# Patient Record
Sex: Male | Born: 1945 | Race: Black or African American | Hispanic: No | Smoking: Former smoker
Health system: Southern US, Community
[De-identification: ages and names within clinical notes are randomized; demographics above are authoritative.]

## PROBLEM LIST (undated history)

## (undated) DIAGNOSIS — I219 Acute myocardial infarction, unspecified: Secondary | ICD-10-CM

---

## 1999-02-10 ENCOUNTER — Encounter: Admission: RE | Admit: 1999-02-10 | Discharge: 1999-03-17 | Payer: Self-pay | Admitting: *Deleted

## 2003-12-13 ENCOUNTER — Emergency Department (HOSPITAL_COMMUNITY): Admission: EM | Admit: 2003-12-13 | Discharge: 2003-12-13 | Payer: Self-pay | Admitting: Emergency Medicine

## 2004-12-14 ENCOUNTER — Emergency Department (HOSPITAL_COMMUNITY): Admission: EM | Admit: 2004-12-14 | Discharge: 2004-12-14 | Payer: Self-pay | Admitting: Emergency Medicine

## 2005-11-05 ENCOUNTER — Ambulatory Visit (HOSPITAL_COMMUNITY): Admission: RE | Admit: 2005-11-05 | Discharge: 2005-11-05 | Payer: Self-pay | Admitting: Surgery

## 2005-11-10 ENCOUNTER — Ambulatory Visit (HOSPITAL_COMMUNITY): Admission: RE | Admit: 2005-11-10 | Discharge: 2005-11-10 | Payer: Self-pay | Admitting: Surgery

## 2005-11-22 ENCOUNTER — Ambulatory Visit (HOSPITAL_COMMUNITY): Admission: RE | Admit: 2005-11-22 | Discharge: 2005-11-22 | Payer: Self-pay | Admitting: Surgery

## 2005-11-22 ENCOUNTER — Encounter (INDEPENDENT_AMBULATORY_CARE_PROVIDER_SITE_OTHER): Payer: Self-pay | Admitting: Specialist

## 2005-12-03 ENCOUNTER — Encounter (INDEPENDENT_AMBULATORY_CARE_PROVIDER_SITE_OTHER): Payer: Self-pay | Admitting: *Deleted

## 2005-12-03 ENCOUNTER — Inpatient Hospital Stay (HOSPITAL_COMMUNITY)
Admission: RE | Admit: 2005-12-03 | Discharge: 2005-12-10 | Payer: Self-pay | Admitting: Thoracic Surgery (Cardiothoracic Vascular Surgery)

## 2005-12-27 ENCOUNTER — Encounter
Admission: RE | Admit: 2005-12-27 | Discharge: 2005-12-27 | Payer: Self-pay | Admitting: Thoracic Surgery (Cardiothoracic Vascular Surgery)

## 2006-03-08 ENCOUNTER — Inpatient Hospital Stay (HOSPITAL_COMMUNITY): Admission: EM | Admit: 2006-03-08 | Discharge: 2006-04-02 | Payer: Self-pay | Admitting: Emergency Medicine

## 2006-03-08 ENCOUNTER — Ambulatory Visit: Payer: Self-pay | Admitting: Pulmonary Disease

## 2006-03-08 ENCOUNTER — Ambulatory Visit: Payer: Self-pay | Admitting: Internal Medicine

## 2006-03-28 ENCOUNTER — Encounter: Payer: Self-pay | Admitting: Cardiovascular Disease

## 2006-09-24 IMAGING — CR DG CHEST 1V PORT
1 series · 1 of 1 positions shown · non-contrast
Comparison: Yesterday?s exam.

CLINICAL DATA: Status-post left VATS. 
PORTABLE CHEST - 1 VIEW ([DATE] HOURS):

[view not recorded]
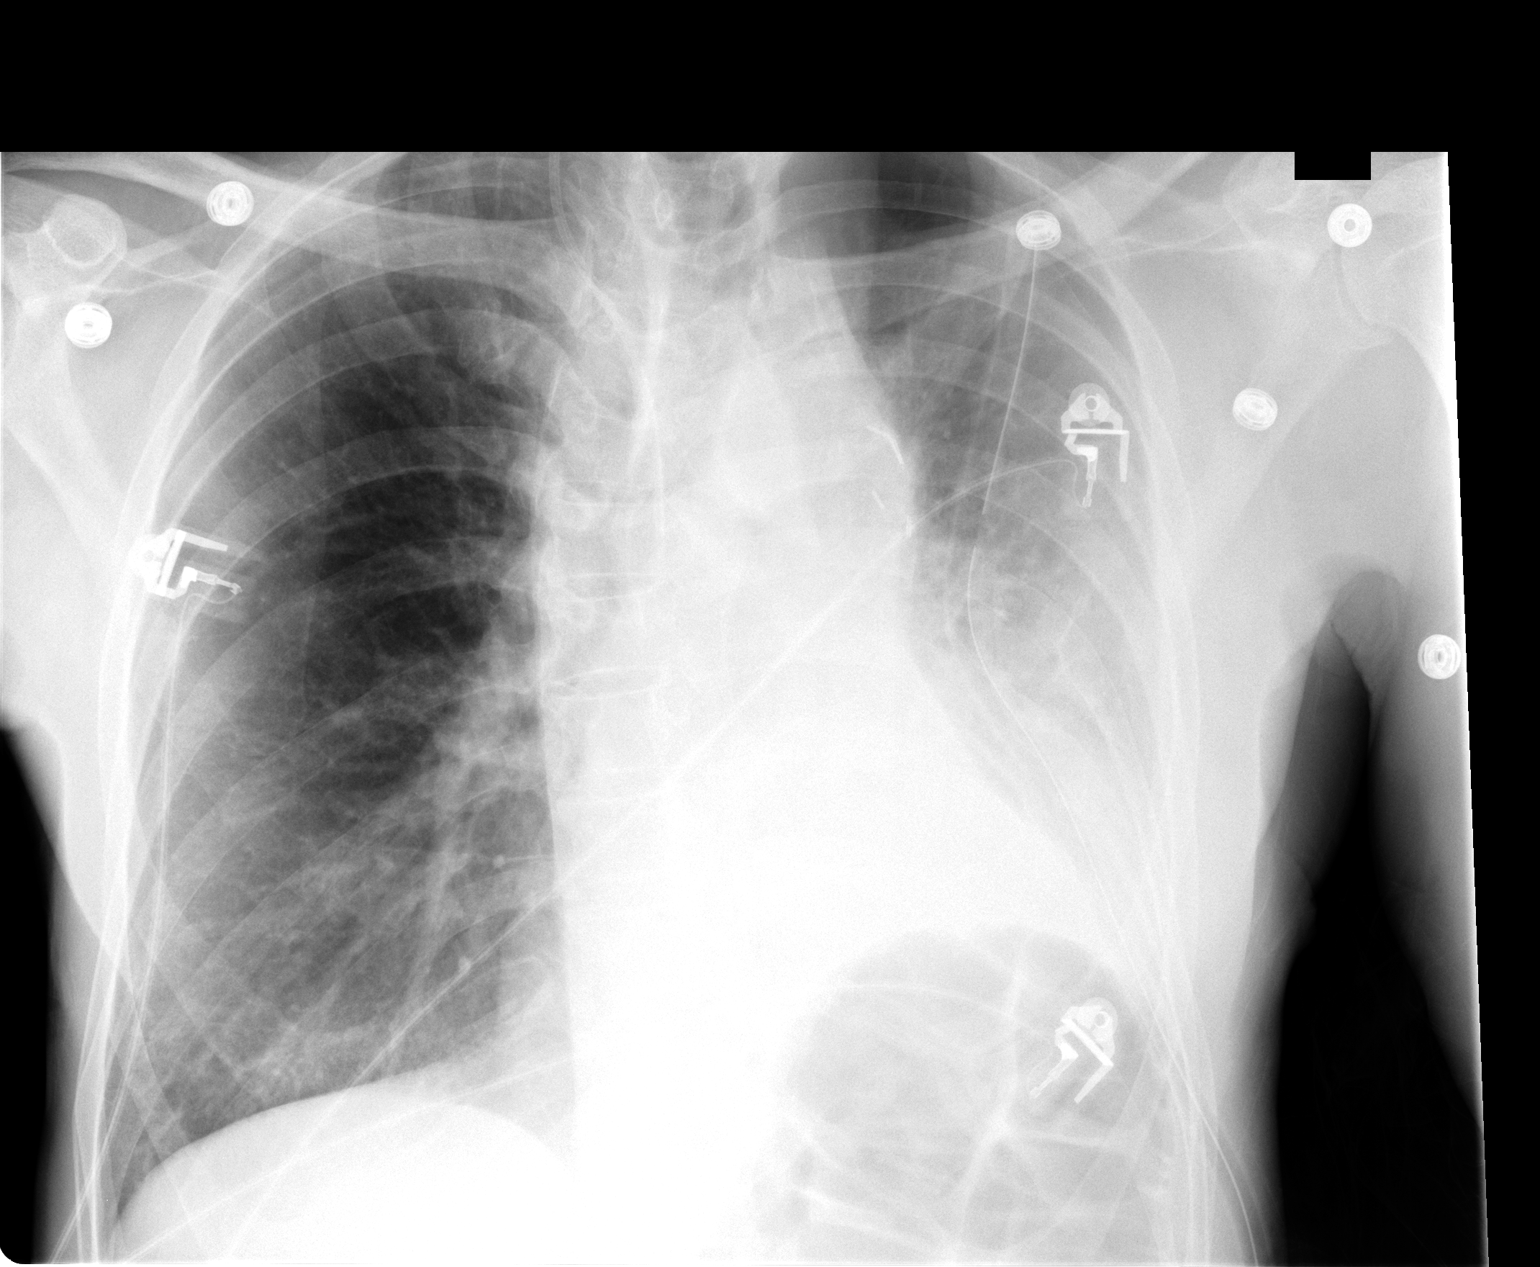

[1 of 1 positions shown; findings below may reference images not displayed]

FINDINGS: Atelectatic changes at the left base and left pneumothorax are again noted.  There may be a left pleural effusion.  Chest tube tip is at the apex of the left hemithorax.  Right jugular CVC tip is in the SVC.  
Left pneumothorax is estimated to be approximately 10% in degree slightly smaller than noted on the prior exam.
IMPRESSION: Slight decrease in small left apical pneumothorax.  Extensive left lung atelectatic changes and possible left pleural effusion.

## 2006-09-25 IMAGING — CR DG CHEST 1V PORT
1 series · 1 of 1 positions shown · non-contrast
Comparison: none

CLINICAL DATA: Left lung carcinoma.  Postop. Follow-up left pneumothorax after chest tube removal.
 PORTABLE CHEST - 1 VIEW (2211 hours):

[view not recorded]
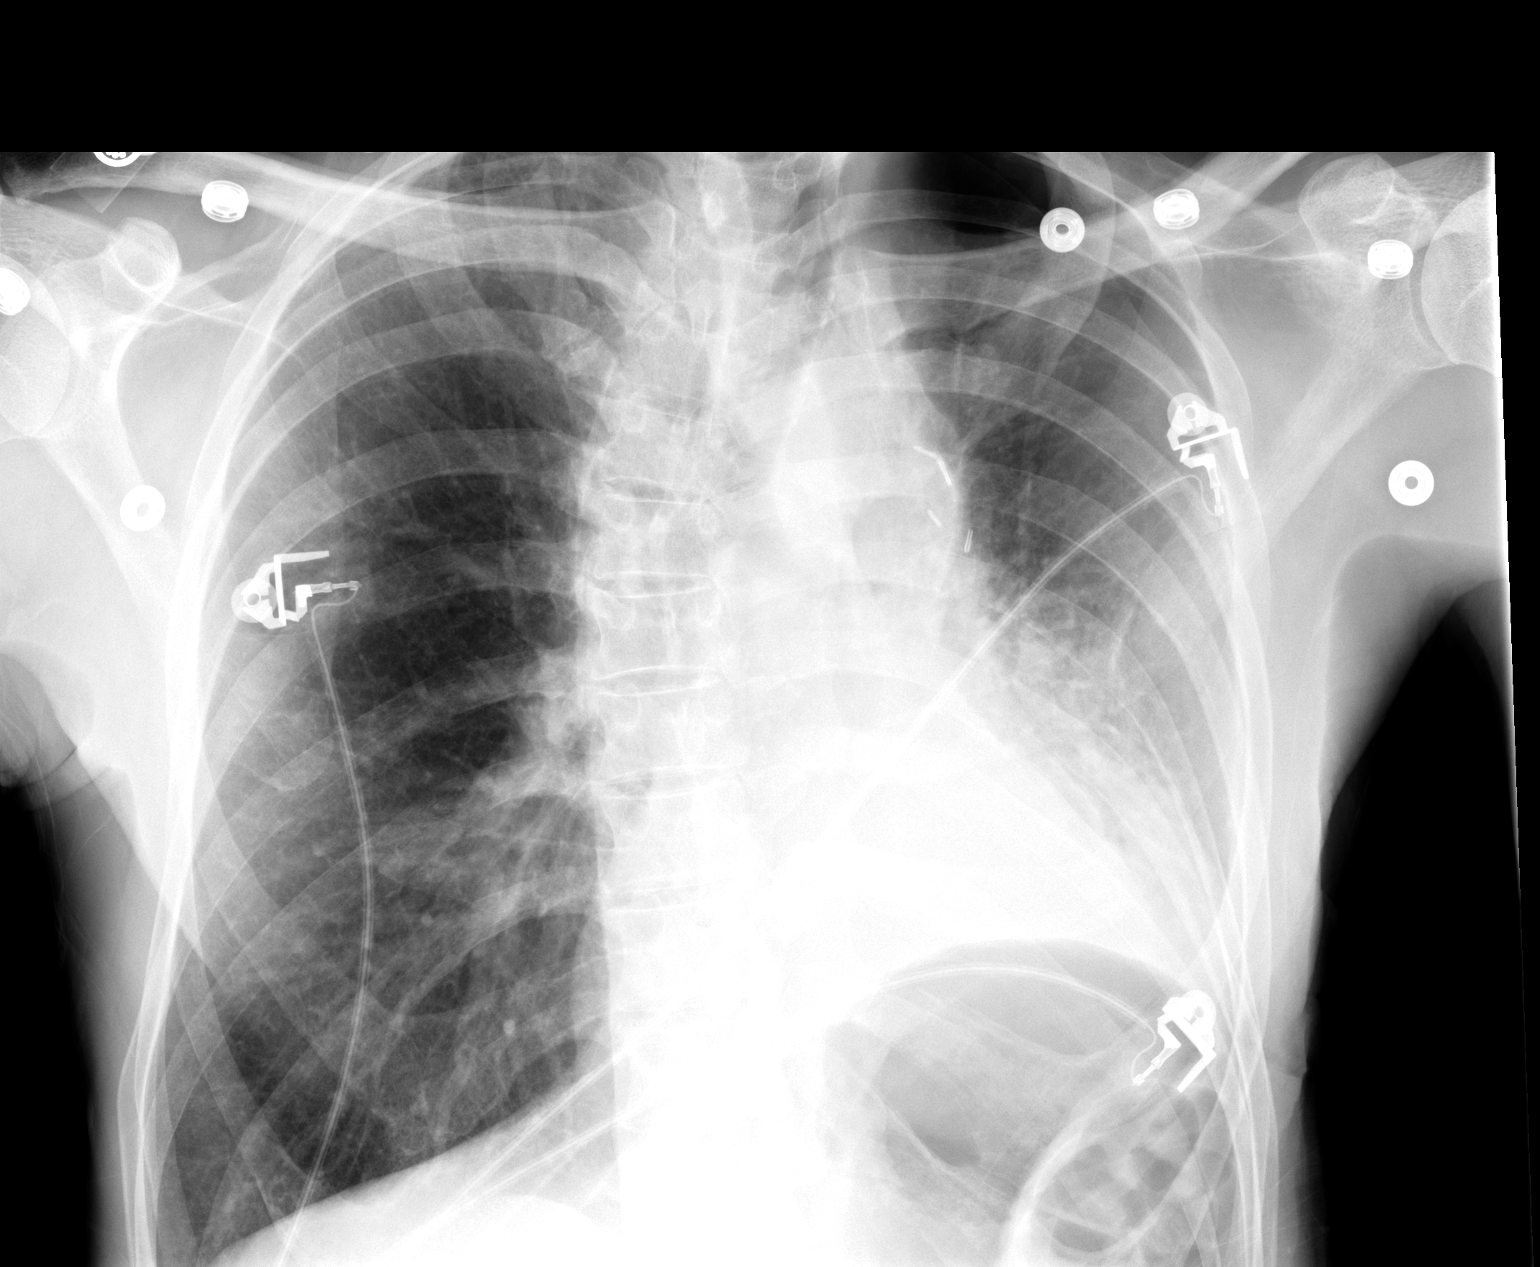

[1 of 1 positions shown; findings below may reference images not displayed]

FINDINGS: Compared to a prior study today at 5255 hours, the left chest tube has been removed.  A small approximately 10 to 15% left apical pneumothorax is slightly increased in size.  
 Persistent opacity is seen in the left lung base with associated volume loss and is unchanged. Right lung remains clear.
IMPRESSION: 1.  Mild interval increase in size of 10-15% left pneumothorax following chest tube removal.
 2.  Persistent left lower lung opacity and volume loss, without change.

## 2006-09-25 IMAGING — CR DG CHEST 1V PORT
1 series · 1 of 1 positions shown · non-contrast
Comparison: 12/08/05.

CLINICAL DATA: 59-year-old male, left lung cancer, status-post VATS. 
PORTABLE CHEST ? 1 VIEW:

[view not recorded]
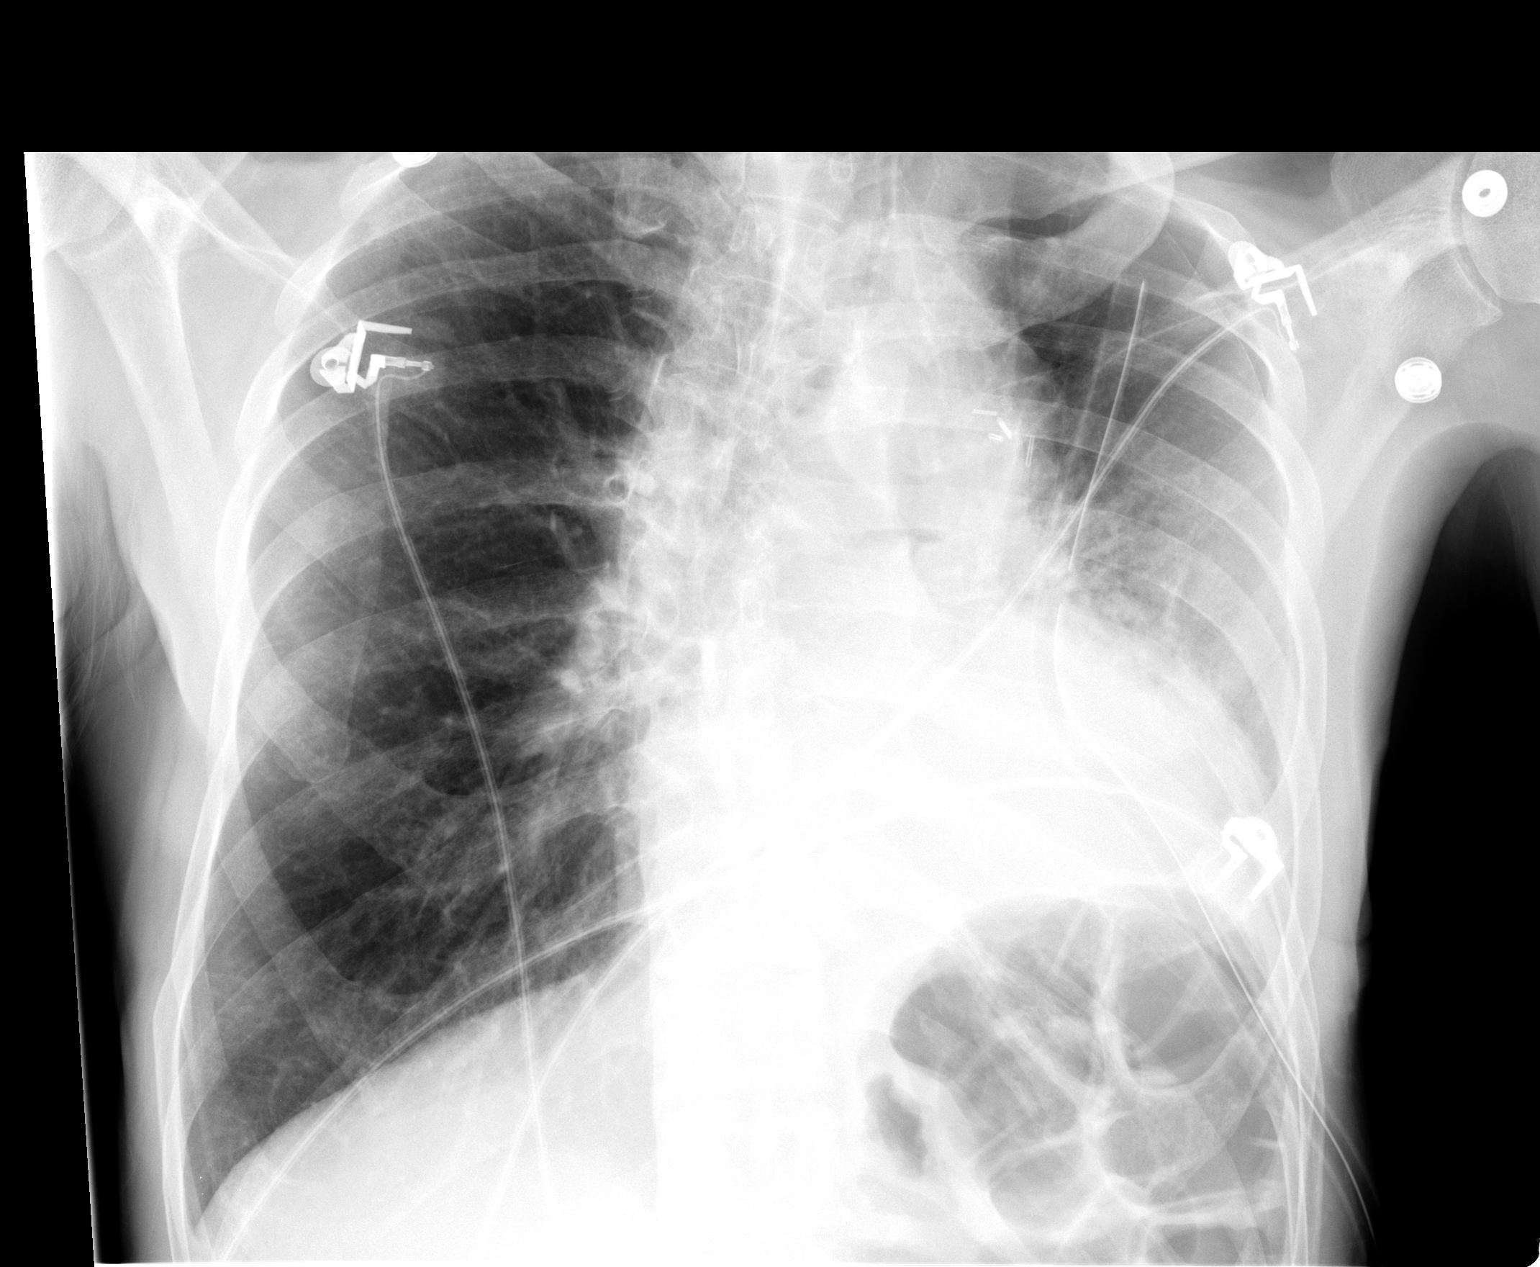

[1 of 1 positions shown; findings below may reference images not displayed]

FINDINGS: Left chest tube remains.  Postoperative changes, airspace disease and atelectasis remain in the left lower lobe.  Stable small left apical pneumothorax estimated at 10% or less.  Right lung remains well aerated.  Right central line has been removed.  Mild scoliosis of the upper thoracic spine.  Exam is rotated to the left.
IMPRESSION: 1.  Stable small left apical pneumothorax estimated at 10% or less.  
2.  Left chest tube remains.  
3.  Postoperative changes with airspace disease and atelectasis in the left lower lobe, unchanged.

## 2006-10-13 IMAGING — CR DG CHEST 2V
2 series · 2 of 2 positions shown · non-contrast
Comparison: 12/10/05 and 12/09/05.

CLINICAL DATA: Status post left upper lobectomy.  
 TWO VIEW CHEST:

[view not recorded (1 of 2)]
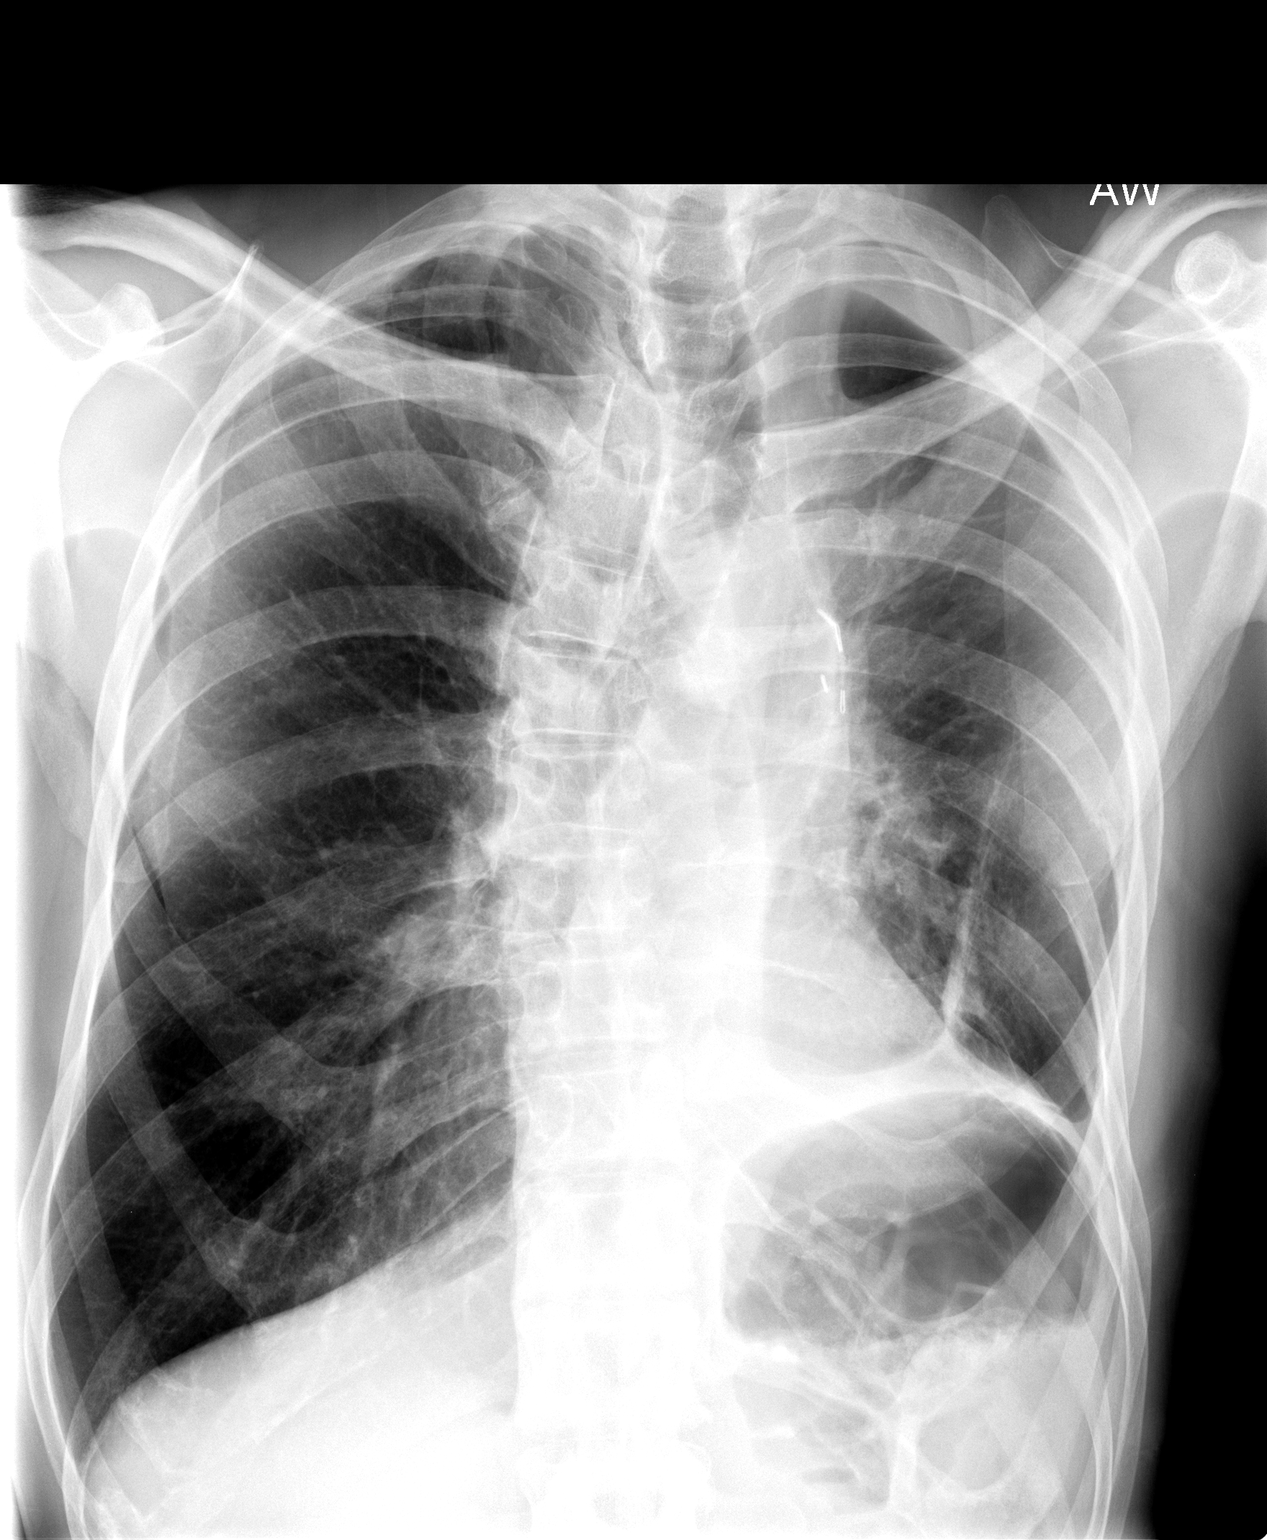

[view not recorded (2 of 2)]
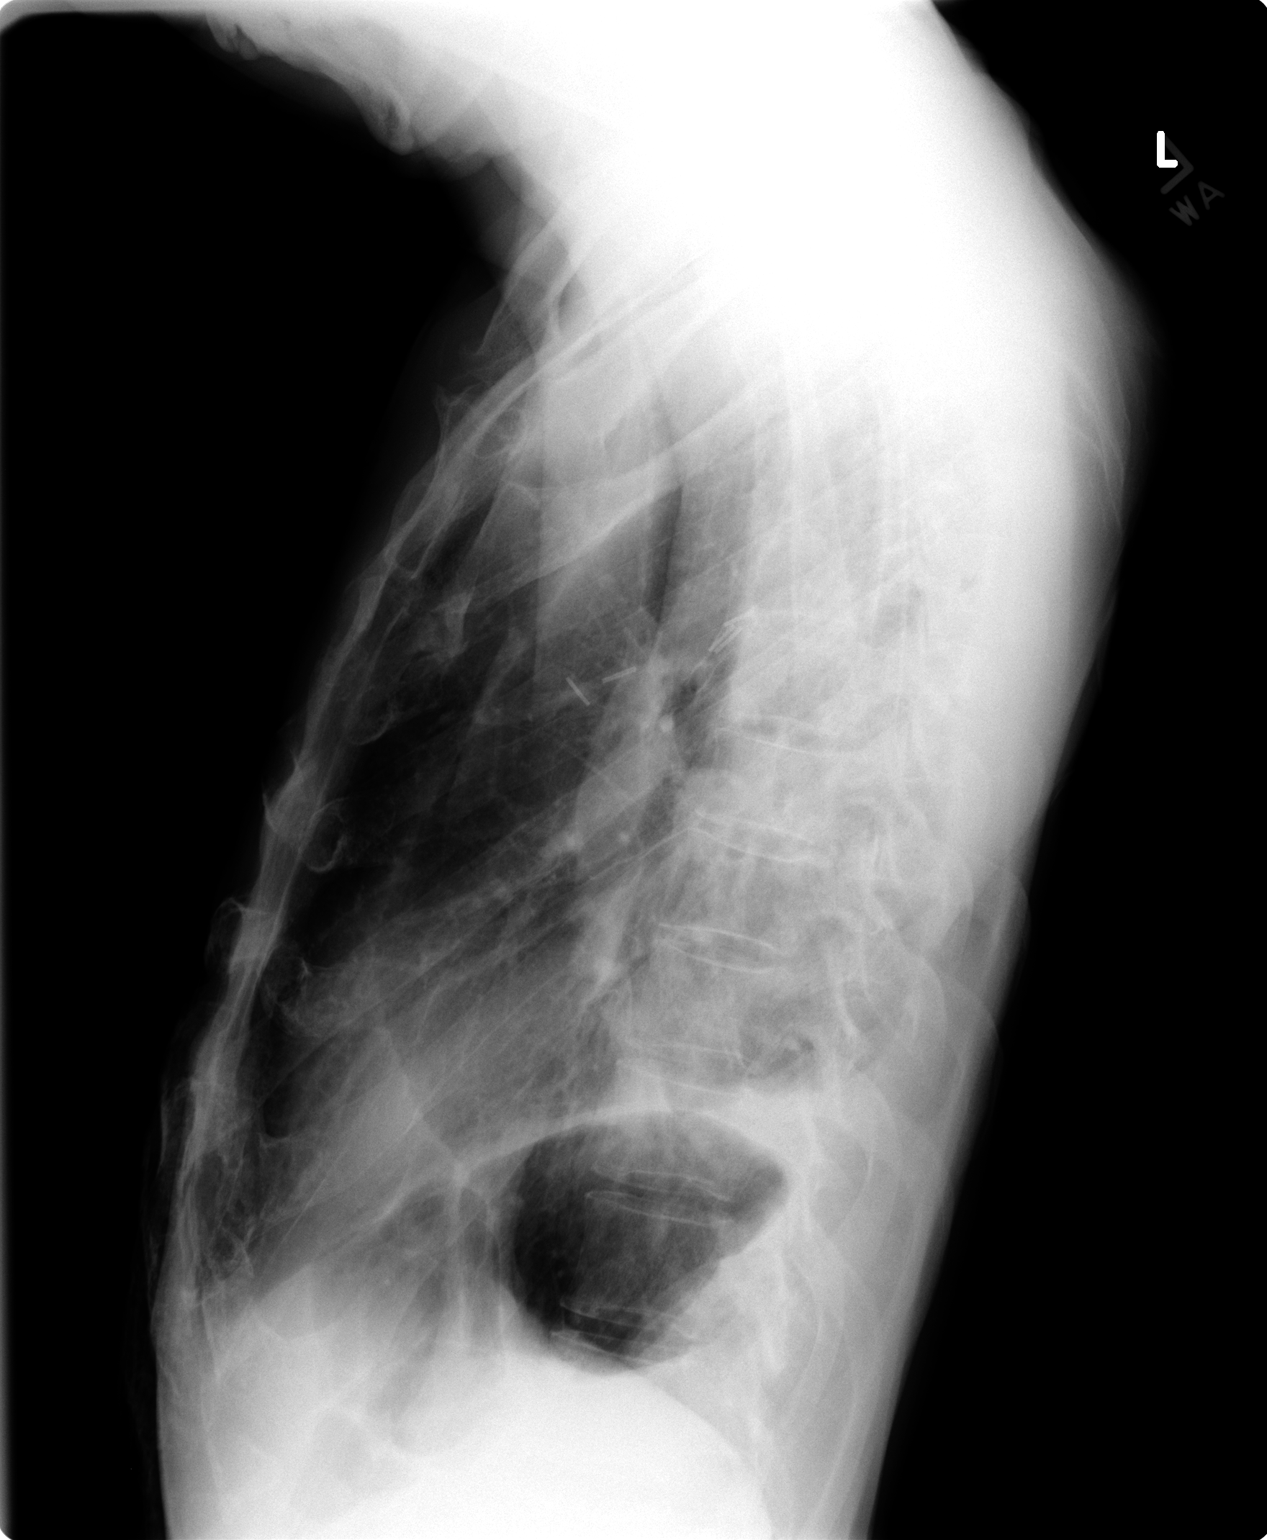

[2 of 2 positions shown; findings below may reference images not displayed]

FINDINGS: Spinal curvature with resultant distortion appearance of the chest.  Right lung clear.  Improved left base aeration with decreased air space disease.  Linear atelectasis remains.  Surgical changes left side of the   mediastinum and hilum.  Fluid now seen in the left pleural space without evidence of residual left pneumothorax.
IMPRESSION: 1.  Improved appearance of the chest.  The left sided pneumothorax has resolved with left apical pleural fluid now present. 
 2.  Improved left base aeration with minimal residual atelectasis or scarring remaining.

## 2007-07-05 ENCOUNTER — Encounter: Admission: RE | Admit: 2007-07-05 | Discharge: 2007-07-05 | Payer: Self-pay | Admitting: Internal Medicine

## 2007-10-12 ENCOUNTER — Emergency Department (HOSPITAL_COMMUNITY): Admission: EM | Admit: 2007-10-12 | Discharge: 2007-10-13 | Payer: Self-pay | Admitting: Emergency Medicine

## 2008-01-25 ENCOUNTER — Ambulatory Visit: Payer: Self-pay | Admitting: Cardiology

## 2008-01-26 ENCOUNTER — Ambulatory Visit: Payer: Self-pay | Admitting: Internal Medicine

## 2008-01-26 ENCOUNTER — Inpatient Hospital Stay (HOSPITAL_COMMUNITY): Admission: EM | Admit: 2008-01-26 | Discharge: 2008-02-03 | Payer: Self-pay | Admitting: Emergency Medicine

## 2008-01-26 ENCOUNTER — Ambulatory Visit: Payer: Self-pay | Admitting: Cardiology

## 2008-01-26 ENCOUNTER — Ambulatory Visit: Payer: Self-pay | Admitting: Oncology

## 2008-01-28 ENCOUNTER — Encounter (INDEPENDENT_AMBULATORY_CARE_PROVIDER_SITE_OTHER): Payer: Self-pay | Admitting: Internal Medicine

## 2008-02-21 ENCOUNTER — Ambulatory Visit: Payer: Self-pay | Admitting: Cardiovascular Disease

## 2008-05-14 ENCOUNTER — Ambulatory Visit: Payer: Self-pay | Admitting: Internal Medicine

## 2008-05-14 ENCOUNTER — Inpatient Hospital Stay (HOSPITAL_COMMUNITY): Admission: EM | Admit: 2008-05-14 | Discharge: 2008-05-17 | Payer: Self-pay | Admitting: Emergency Medicine

## 2009-07-14 ENCOUNTER — Ambulatory Visit: Payer: Self-pay | Admitting: Pulmonary Disease

## 2009-07-14 ENCOUNTER — Encounter (INDEPENDENT_AMBULATORY_CARE_PROVIDER_SITE_OTHER): Payer: Self-pay | Admitting: Internal Medicine

## 2009-07-14 ENCOUNTER — Inpatient Hospital Stay (HOSPITAL_COMMUNITY): Admission: EM | Admit: 2009-07-14 | Discharge: 2009-07-19 | Payer: Self-pay | Admitting: Emergency Medicine

## 2009-07-14 ENCOUNTER — Ambulatory Visit: Payer: Self-pay | Admitting: Cardiology

## 2009-07-25 DIAGNOSIS — I10 Essential (primary) hypertension: Secondary | ICD-10-CM | POA: Insufficient documentation

## 2009-07-25 DIAGNOSIS — E039 Hypothyroidism, unspecified: Secondary | ICD-10-CM | POA: Insufficient documentation

## 2009-08-04 ENCOUNTER — Ambulatory Visit: Payer: Self-pay | Admitting: Cardiology

## 2009-08-04 ENCOUNTER — Inpatient Hospital Stay (HOSPITAL_COMMUNITY): Admission: EM | Admit: 2009-08-04 | Discharge: 2009-08-07 | Payer: Self-pay | Admitting: Emergency Medicine

## 2009-08-05 ENCOUNTER — Encounter: Payer: Self-pay | Admitting: Cardiology

## 2009-08-21 ENCOUNTER — Ambulatory Visit: Payer: Self-pay | Admitting: Cardiology

## 2009-08-21 ENCOUNTER — Encounter (INDEPENDENT_AMBULATORY_CARE_PROVIDER_SITE_OTHER): Payer: Self-pay | Admitting: *Deleted

## 2009-08-21 DIAGNOSIS — I429 Cardiomyopathy, unspecified: Secondary | ICD-10-CM | POA: Insufficient documentation

## 2009-08-21 DIAGNOSIS — F1010 Alcohol abuse, uncomplicated: Secondary | ICD-10-CM | POA: Insufficient documentation

## 2009-08-21 DIAGNOSIS — I2510 Atherosclerotic heart disease of native coronary artery without angina pectoris: Secondary | ICD-10-CM | POA: Insufficient documentation

## 2009-08-21 DIAGNOSIS — F17200 Nicotine dependence, unspecified, uncomplicated: Secondary | ICD-10-CM | POA: Insufficient documentation

## 2009-08-21 DIAGNOSIS — I9589 Other hypotension: Secondary | ICD-10-CM | POA: Insufficient documentation

## 2009-10-17 ENCOUNTER — Ambulatory Visit: Payer: Self-pay | Admitting: Internal Medicine

## 2009-11-10 ENCOUNTER — Ambulatory Visit (HOSPITAL_COMMUNITY): Admission: RE | Admit: 2009-11-10 | Discharge: 2009-11-10 | Payer: Self-pay | Admitting: Internal Medicine

## 2009-11-10 ENCOUNTER — Encounter: Payer: Self-pay | Admitting: Internal Medicine

## 2009-11-10 ENCOUNTER — Ambulatory Visit: Payer: Self-pay | Admitting: Cardiology

## 2009-11-10 ENCOUNTER — Ambulatory Visit: Payer: Self-pay

## 2009-12-19 ENCOUNTER — Encounter: Payer: Self-pay | Admitting: Internal Medicine

## 2010-01-01 ENCOUNTER — Encounter (INDEPENDENT_AMBULATORY_CARE_PROVIDER_SITE_OTHER): Payer: Self-pay | Admitting: *Deleted

## 2010-06-22 ENCOUNTER — Inpatient Hospital Stay (HOSPITAL_COMMUNITY)
Admission: EM | Admit: 2010-06-22 | Discharge: 2010-07-02 | Disposition: A | Discharge status: Other Acute Inpatient Hospital | Payer: Self-pay | Source: Home / Self Care | Attending: Internal Medicine | Admitting: Internal Medicine

## 2010-08-13 NOTE — Miscellaneous (Signed)
  Clinical Lists Changes  Observations: Added new observation of ECHOINTERP: - Left ventricle: The cavity size was normal. Wall thickness was       normal. Systolic function was normal. The estimated ejection       fraction was in the range of 55% to 60%. Wall motion was normal;       there were no regional wall motion abnormalities.     - Mitral valve: Mild regurgitation. (11/10/2009 9:51)      Echocardiogram  Procedure date:  11/10/2009  Findings:      - Left ventricle: The cavity size was normal. Wall thickness was       normal. Systolic function was normal. The estimated ejection       fraction was in the range of 55% to 60%. Wall motion was normal;       there were no regional wall motion abnormalities.     - Mitral valve: Mild regurgitation.

## 2010-08-13 NOTE — Miscellaneous (Signed)
  Clinical Lists Changes  Medications: Added new medication of COREG 3.125 MG TABS (CARVEDILOL) take one two times a day - Signed Added new medication of DIGOXIN 0.25 MG TABS (DIGOXIN) take one daily - Signed Added new medication of HYDRALAZINE HCL 25 MG TABS (HYDRALAZINE HCL) take 1/2 daily - Signed Added new medication of SPIRONOLACTONE 25 MG TABS (SPIRONOLACTONE) take one daily - Signed Added new medication of THIAMINE HCL 100 MG TABS (THIAMINE HCL) take one daily - Signed Added new medication of ASPIR-LOW 81 MG TBEC (ASPIRIN) take one daily Added new medication of FOSINOPRIL SODIUM 20 MG TABS (FOSINOPRIL SODIUM) take one dialy

## 2010-08-13 NOTE — Assessment & Plan Note (Signed)
Summary: per check out/sf   Visit Type:  Follow-up [facility]  CC:  no complaints.  History of Present Illness: Ryan Brewer is a 65 year old man with h/o ETOH abuse, Copd, lung cancer s/p lobectomy in 2007.  Carries a h/o of NSTEMI and underwent cath in 2009 with mild nonobstructive CAD.  Earlier this year admitted with ETOH related seizure. Troponin mildly elevated. Got echo: Ejection fraction 20-25% with akinesis of the anteroseptal myocardium and apical myocardium. There was akinesis of the mid distal inferior myocardium. Felt to be non-ischemic. The patient was diuresed and placed on ACE inhibitor and Coreg.  Lisinopril recently increased but developed hypotension  nd had to be cut back. Returns for f/u.  Doing well. No longer drinking. Does get dyspneic on mild to moderate exertion but feels like he is getting stronger. No CP, palpitations, edema, orthopnea or PND. Taking meds. Not drinking at all.  Current Medications (verified): 1)  Coreg 3.125 Mg Tabs (Carvedilol) .... Take One Two Times A Day 2)  Aspir-Low 81 Mg Tbec (Aspirin) .... Take One Daily 3)  Combivent 18-103 Mcg/act Aero (Ipratropium-Albuterol) .... As Needed 4)  Flintstones Complete 60 Mg Chew (Pediatric Multivit-Minerals-C) .... Take One Daily 5)  Fosinopril Sodium 10 Mg Tabs (Fosinopril Sodium) .... Take One Tablet Daily  Allergies (verified): No Known Drug Allergies  Past History:  Past Medical History: Last updated: 07/25/2009 1. Hypertension.   2. Cancer of the lung.   3. Hypothyroidism.   4. Previous myocardial infarction with hypokinesis, ejection fraction       20%-30%.   5. History of pneumonia.   6. Status post left lobectomy because of lung cancer.   7. Significant alcohol abuse with recurrent withdrawal seizure.      Review of Systems       As per HPI and past medical history; otherwise all systems negative.   Vital Signs:  Patient profile:   65 year old male Height:      75  inches Weight:      171 pounds BMI:     21.45 Pulse rate:   89 / minute BP sitting:   119 / 75  (left arm) Cuff size:   regular  Vitals Entered By: Burnett Kanaris, CNA (October 17, 2009 9:10 AM)  Physical Exam  General:  Thin, in no acute distress. HEENT: poor dentition Neck: No JVD, HJR, Bruit, or thyroid enlargement Lungs:Decreased breath sounds throughout, No tachypnea, clear without wheezing, rales, or rhonchi Cardiovascular: RRR, positive S4, 2/6 systolic murmur at the left sternal border and apex, no bruit, thrill, or heave. Abdomen: BS normal. Soft without organomegaly, masses, lesions or tenderness. Extremities: Traces of left ankle edema,without cyanosis, clubbing. Good distal pulses bilateral SKin: Warm, no lesions or rashes  Musculoskeletal: No deformities Neuro: no focal signs    Impression & Recommendations:  Problem # 1:  CARDIOMYOPATHY, SECONDARY (ICD-425.9) Suspect this non-ischemic. Volume status looks good. Will titrate coreg to 6.25 two times a day slowly by increasing pm dose first then am dose. See back in 1 month with repeat echo. If no improvement then will need repeat cath. Congratulated him on ETOH cessation.   Other Orders: Echocardiogram (Echo)  Patient Instructions: 1)  Your physician recommends that you schedule a follow-up appointment in: 2 months 2)  Your physician has recommended you make the following change in your medication: Increase carvedilol to 6.25 mg twice a day.  Start by taking 6.25 mg at night for 5 days  and then increase to twice a day 3)  Your physician has requested that you have an echocardiogram.  Echocardiography is a painless test that uses sound waves to create images of your heart. It provides your doctor with information about the size and shape of your heart and how well your heart's chambers and valves are working.  This procedure takes approximately one hour. There are no restrictions for this  procedure. Prescriptions: CARVEDILOL 6.25 MG TABS (CARVEDILOL) one twice a day  #60 x 3   Entered by:   Charolotte Capuchin, RN   Authorized by:   Dolores Patty, MD, [facility]   Signed by:   Charolotte Capuchin, RN on 10/17/2009   Method used:   Electronically to        [facility] (retail)       [address]       [place], [place]  [address]       Ph: [phone]       Fax: [phone]   RxID:   [id number]

## 2010-08-13 NOTE — Consult Note (Signed)
Summary: [facility]   Imported By: Roderic Ovens 08/08/2009 14:29:21  _____________________________________________________________________  External Attachment:    Type:   Image     Comment:   External Document

## 2010-08-13 NOTE — Letter (Signed)
Summary: Appointment - Missed  [facility]     [place], [place]    Phone:   Fax:      January 01, 2010 MRN: [id number]   Ryan Brewer [address] [place], [place]  [address]   Dear Ryan Brewer,  Our records indicate you missed your appointment on June 13,2011with Dr. Gala Romney.  It is very important that we reach you to reschedule this appointment. We look forward to participating in your health care needs. Please contact us at the number listed above at your earliest convenience to reschedule this appointment.     Sincerely,    Lorne Skeens  [facility] Scheduling Team

## 2010-08-13 NOTE — Assessment & Plan Note (Signed)
Summary: eph/ gd   Visit Type:  Follow-up  CC:  problems with bp.  History of Present Illness: This is a 65 year old, African American male, patient, who was admitted to the hospital with acute systolic heart failure and alcoholic seizure. 2-D echo, January 3 showed severely reduced systolic function. Ejection fraction 20-25% with akinesis of the anteroseptal myocardium and apical myocardium. There was akinesis of the mid distal inferior myocardium. The patient was diuresed and placed on ACE inhibitor and Coreg.  Since he's been home he complains of low blood pressures and some dizziness. He says his blood pressure runs from 80/50 to high of 105/60. His blood pressure is only 70 systolic in the office today, but he denies dizziness. He has occasional dizziness when he stands up quickly. He denies any presyncope. The patient does not drive. He denies alcohol use since hospitalization.  He does have a history of a non-ST elevation MI by cardiac catheter in the past revealed nonobstructive coronary disease. I catheter his LV function was normal in 2 days later. An echo showed EF of 20-25%. The patient is followed in the [facility] as well.  Current Medications (verified): 1)  Coreg 3.125 Mg Tabs (Carvedilol) .... Take One Two Times A Day 2)  Aspir-Low 81 Mg Tbec (Aspirin) .... Take One Daily 3)  Fosinopril Sodium 20 Mg Tabs (Fosinopril Sodium) .... Take One Dialy 4)  Combivent 18-103 Mcg/act Aero (Ipratropium-Albuterol) .... As Needed 5)  Flintstones Complete 60 Mg Chew (Pediatric Multivit-Minerals-C) .... Take One Daily  Past History:  Past Medical History: Last updated: 07/25/2009 1. Hypertension.   2. Cancer of the lung.   3. Hypothyroidism.   4. Previous myocardial infarction with hypokinesis, ejection fraction       20%-30%.   5. History of pneumonia.   6. Status post left lobectomy because of lung cancer.   7. Significant alcohol abuse with recurrent withdrawal seizure.       Review of Systems       see history of present illness  Vital Signs:  Patient profile:   65 year old male Height:      75 inches Weight:      149 pounds BMI:     18.69 Pulse rate:   104 / minute Pulse rhythm:   regular BP sitting:   70 / 50  (left arm)  Vitals Entered By: Jacquelin Hawking, CMA (August 21, 2009 9:25 AM)  Physical Exam  General:  Thin, in no acute distress. Neck: No JVD, HJR, Bruit, or thyroid enlargement Lungs:Decreased breath sounds throughout, No tachypnea, clear without wheezing, rales, or rhonchi Cardiovascular: RRR, positive S4, 2/6 systolic murmur at the left sternal border and apex, no bruit, thrill, or heave. Abdomen: BS normal. Soft without organomegaly, masses, lesions or tenderness. Extremities: Traces of left ankle edema,without cyanosis, clubbing. Good distal pulses bilateral SKin: Warm, no lesions or rashes  Musculoskeletal: No deformities Neuro: no focal signs    Impression & Recommendations:  Problem # 1:  CARDIOMYOPATHY, SECONDARY (ICD-425.9) The patient had acute systolic congestive heart failure in the hospital. Ejection fraction is 20-25%. He is being treated with Coreg and lisinopril. Unfortunately, his blood pressure is now tolerating this. We will decrease his fosinopril 10 mg daily. If the dizziness and hypotension. Continue this will need to be stopped. He is euvolemic today. The following medications were removed from the medication list:    Digoxin 0.25 Mg Tabs (Digoxin) .Marland Kitchen... Take one daily    Spironolactone 25 Mg Tabs (Spironolactone) .Marland KitchenMarland KitchenMarland KitchenMarland Kitchen  Take one daily    Fosinopril Sodium 20 Mg Tabs (Fosinopril sodium) .Marland Kitchen... Take one dialy His updated medication list for this problem includes:    Coreg 3.125 Mg Tabs (Carvedilol) .Marland Kitchen... Take one two times a day    Aspir-low 81 Mg Tbec (Aspirin) .Marland Kitchen... Take one daily    Fosinopril Sodium 10 Mg Tabs (Fosinopril sodium) .Marland Kitchen... Take one tablet daily  Problem # 2:  CAD, NATIVE VESSEL  (ICD-414.01)  Patient had previous MIs, but cardiac catheter in 2010 revealed nonobstructive coronary disease. The following medications were removed from the medication list:    Fosinopril Sodium 20 Mg Tabs (Fosinopril sodium) .Marland Kitchen... Take one dialy His updated medication list for this problem includes:    Coreg 3.125 Mg Tabs (Carvedilol) .Marland Kitchen... Take one two times a day    Aspir-low 81 Mg Tbec (Aspirin) .Marland Kitchen... Take one daily    Fosinopril Sodium 10 Mg Tabs (Fosinopril sodium) .Marland Kitchen... Take one tablet daily  The following medications were removed from the medication list:    Fosinopril Sodium 20 Mg Tabs (Fosinopril sodium) .Marland Kitchen... Take one dialy His updated medication list for this problem includes:    Coreg 3.125 Mg Tabs (Carvedilol) .Marland Kitchen... Take one two times a day    Aspir-low 81 Mg Tbec (Aspirin) .Marland Kitchen... Take one daily    Fosinopril Sodium 10 Mg Tabs (Fosinopril sodium) .Marland Kitchen... Take one tablet daily  Problem # 3:  ALCOHOL ABUSE (ICD-305.00) Patient had alcoholic seizure in the hospital bed and has abstained from alcohol since  Problem # 4:  TOBACCO ABUSE (ICD-305.1) smoking cessation is encouraged  Patient Instructions: 1)  Your physician recommends that you schedule a follow-up appointment in: 2 months with Dr. Gala Romney 2)  Your physician has recommended you make the following change in your medication: Take 1/2 tablet of the 20mg  Fosinopril tablet till finish.Then take  1/4 fo the  40 mg tablet till finish .Then start the new   Fosinopril 10 mg daily. 3)  Your MD recommends to stop smoking. Prescriptions: COREG 3.125 MG TABS (CARVEDILOL) take one two times a day  #60 x 6   Entered by:   Ollen Gross, RN, BSN   Authorized by:   Marletta Lor, PA-C   Signed by:   Ollen Gross, RN, BSN on 08/21/2009   Method used:   Electronically to        [facility] (retail)       [address]       [place], [place]  [address]       Ph: [phone]       Fax:  [phone]   RxID:   [id number] FOSINOPRIL SODIUM 10 MG TABS (FOSINOPRIL SODIUM) take one tablet daily  #30 x 6   Entered by:   Ollen Gross, RN, BSN   Authorized by:   Marletta Lor, PA-C   Signed by:   Ollen Gross, RN, BSN on 08/21/2009   Method used:   Electronically to        [facility] (retail)       [address]       [place], [place]  [address]       Ph: [phone]       Fax: [phone]   RxID:   [id number]

## 2010-08-23 ENCOUNTER — Emergency Department (HOSPITAL_COMMUNITY)
Admission: EM | Admit: 2010-08-23 | Discharge: 2010-08-23 | Disposition: A | Discharge status: Home / Self Care | Payer: Medicare Other | Attending: Emergency Medicine | Admitting: Emergency Medicine

## 2010-08-23 ENCOUNTER — Emergency Department (HOSPITAL_COMMUNITY): Payer: Medicare Other

## 2010-08-23 DIAGNOSIS — R0609 Other forms of dyspnea: Secondary | ICD-10-CM | POA: Insufficient documentation

## 2010-08-23 DIAGNOSIS — R05 Cough: Secondary | ICD-10-CM | POA: Insufficient documentation

## 2010-08-23 DIAGNOSIS — R0602 Shortness of breath: Secondary | ICD-10-CM | POA: Insufficient documentation

## 2010-08-23 DIAGNOSIS — M549 Dorsalgia, unspecified: Secondary | ICD-10-CM | POA: Insufficient documentation

## 2010-08-23 DIAGNOSIS — R0989 Other specified symptoms and signs involving the circulatory and respiratory systems: Secondary | ICD-10-CM | POA: Insufficient documentation

## 2010-08-23 DIAGNOSIS — I10 Essential (primary) hypertension: Secondary | ICD-10-CM | POA: Insufficient documentation

## 2010-08-23 DIAGNOSIS — M79609 Pain in unspecified limb: Secondary | ICD-10-CM | POA: Insufficient documentation

## 2010-08-23 DIAGNOSIS — Z85118 Personal history of other malignant neoplasm of bronchus and lung: Secondary | ICD-10-CM | POA: Insufficient documentation

## 2010-08-23 DIAGNOSIS — R059 Cough, unspecified: Secondary | ICD-10-CM | POA: Insufficient documentation

## 2010-08-23 DIAGNOSIS — M25559 Pain in unspecified hip: Secondary | ICD-10-CM | POA: Insufficient documentation

## 2010-08-23 DIAGNOSIS — I252 Old myocardial infarction: Secondary | ICD-10-CM | POA: Insufficient documentation

## 2010-08-23 DIAGNOSIS — Z902 Acquired absence of lung [part of]: Secondary | ICD-10-CM | POA: Insufficient documentation

## 2010-08-23 LAB — DIFFERENTIAL
Basophils Absolute: 0 10*3/uL (ref 0.0–0.1)
Basophils Relative: 0 % (ref 0–1)
Eosinophils Absolute: 0.1 10*3/uL (ref 0.0–0.7)
Eosinophils Relative: 1 % (ref 0–5)
Lymphocytes Relative: 11 % — ABNORMAL LOW (ref 12–46)

## 2010-08-23 LAB — POCT I-STAT, CHEM 8
BUN: 21 mg/dL (ref 6–23)
Chloride: 105 mEq/L (ref 96–112)
HCT: 38 % — ABNORMAL LOW (ref 39.0–52.0)
Sodium: 141 mEq/L (ref 135–145)

## 2010-08-23 LAB — URINE MICROSCOPIC-ADD ON

## 2010-08-23 LAB — URINALYSIS, ROUTINE W REFLEX MICROSCOPIC
Bilirubin Urine: NEGATIVE
Ketones, ur: NEGATIVE mg/dL
Protein, ur: NEGATIVE mg/dL
Urine Glucose, Fasting: NEGATIVE mg/dL
pH: 5 (ref 5.0–8.0)

## 2010-08-23 LAB — CBC
Platelets: 184 10*3/uL (ref 150–400)
RDW: 14.2 % (ref 11.5–15.5)
WBC: 7.8 10*3/uL (ref 4.0–10.5)

## 2010-08-23 MED ORDER — IOHEXOL 300 MG/ML  SOLN
100.0000 mL | Freq: Once | INTRAMUSCULAR | Status: AC | PRN
  Administered 2010-08-23: 100 mL via INTRAVENOUS

## 2010-09-09 ENCOUNTER — Encounter (INDEPENDENT_AMBULATORY_CARE_PROVIDER_SITE_OTHER): Payer: Medicare Other

## 2010-09-09 ENCOUNTER — Encounter: Payer: Self-pay | Admitting: Cardiovascular Disease

## 2010-09-09 DIAGNOSIS — M79609 Pain in unspecified limb: Secondary | ICD-10-CM

## 2010-09-17 NOTE — Miscellaneous (Signed)
Summary: Orders Update  Clinical Lists Changes  Problems: Added new problem of FOOT PAIN, BILATERAL (ICD-729.5) Orders: Added new Test order of Arterial Duplex Lower Extremity (Arterial Duplex Low) - Signed

## 2010-09-21 LAB — HEMOGLOBIN AND HEMATOCRIT, BLOOD
HCT: 24.1 % — ABNORMAL LOW (ref 39.0–52.0)
HCT: 24.3 % — ABNORMAL LOW (ref 39.0–52.0)
HCT: 25.2 % — ABNORMAL LOW (ref 39.0–52.0)
Hemoglobin: 8.4 g/dL — ABNORMAL LOW (ref 13.0–17.0)
Hemoglobin: 8.5 g/dL — ABNORMAL LOW (ref 13.0–17.0)
Hemoglobin: 8.9 g/dL — ABNORMAL LOW (ref 13.0–17.0)

## 2010-09-21 LAB — BASIC METABOLIC PANEL
BUN: 3 mg/dL — ABNORMAL LOW (ref 6–23)
BUN: 4 mg/dL — ABNORMAL LOW (ref 6–23)
BUN: 4 mg/dL — ABNORMAL LOW (ref 6–23)
BUN: 5 mg/dL — ABNORMAL LOW (ref 6–23)
BUN: 5 mg/dL — ABNORMAL LOW (ref 6–23)
BUN: 6 mg/dL (ref 6–23)
CO2: 24 mEq/L (ref 19–32)
Calcium: 7.9 mg/dL — ABNORMAL LOW (ref 8.4–10.5)
Calcium: 8.4 mg/dL (ref 8.4–10.5)
Calcium: 8.5 mg/dL (ref 8.4–10.5)
Chloride: 103 mEq/L (ref 96–112)
Chloride: 105 mEq/L (ref 96–112)
Chloride: 107 mEq/L (ref 96–112)
Chloride: 107 mEq/L (ref 96–112)
Chloride: 108 mEq/L (ref 96–112)
Chloride: 109 mEq/L (ref 96–112)
Creatinine, Ser: 0.63 mg/dL (ref 0.4–1.5)
Creatinine, Ser: 0.67 mg/dL (ref 0.4–1.5)
Creatinine, Ser: 0.76 mg/dL (ref 0.4–1.5)
Creatinine, Ser: 0.94 mg/dL (ref 0.4–1.5)
GFR calc Af Amer: >60 mL/min (ref >60)
GFR calc Af Amer: >60 mL/min (ref >60)
GFR calc non Af Amer: >60 mL/min (ref >60)
GFR calc non Af Amer: >60 mL/min (ref >60)
GFR calc non Af Amer: >60 mL/min (ref >60)
GFR calc non Af Amer: >60 mL/min (ref >60)
GFR calc non Af Amer: >60 mL/min (ref >60)
Glucose, Bld: 103 mg/dL — ABNORMAL HIGH (ref 70–99)
Glucose, Bld: 119 mg/dL — ABNORMAL HIGH (ref 70–99)
Glucose, Bld: 124 mg/dL — ABNORMAL HIGH (ref 70–99)
Glucose, Bld: 89 mg/dL (ref 70–99)
Potassium: 2.9 mEq/L — ABNORMAL LOW (ref 3.5–5.1)
Potassium: 3.5 mEq/L (ref 3.5–5.1)
Potassium: 3.6 mEq/L (ref 3.5–5.1)
Potassium: 3.8 mEq/L (ref 3.5–5.1)
Sodium: 138 mEq/L (ref 135–145)
Sodium: 140 mEq/L (ref 135–145)
Sodium: 140 mEq/L (ref 135–145)

## 2010-09-21 LAB — CBC
HCT: 23.1 % — ABNORMAL LOW (ref 39.0–52.0)
HCT: 25.1 % — ABNORMAL LOW (ref 39.0–52.0)
Hemoglobin: 7.9 g/dL — ABNORMAL LOW (ref 13.0–17.0)
Hemoglobin: 7.9 g/dL — ABNORMAL LOW (ref 13.0–17.0)
Hemoglobin: 8.7 g/dL — ABNORMAL LOW (ref 13.0–17.0)
MCH: 33.5 pg (ref 26.0–34.0)
MCH: 34.2 pg — ABNORMAL HIGH (ref 26.0–34.0)
MCHC: 34.2 g/dL (ref 30.0–36.0)
MCV: 100 fL (ref 78.0–100.0)
MCV: 97.1 fL (ref 78.0–100.0)
MCV: 98 fL (ref 78.0–100.0)
MCV: 98 fL (ref 78.0–100.0)
MCV: 98.7 fL (ref 78.0–100.0)
Platelets: 109 10*3/uL — ABNORMAL LOW (ref 150–400)
Platelets: 112 10*3/uL — ABNORMAL LOW (ref 150–400)
RBC: 2.31 MIL/uL — ABNORMAL LOW (ref 4.22–5.81)
RBC: 2.45 MIL/uL — ABNORMAL LOW (ref 4.22–5.81)
RDW: 16.5 % — ABNORMAL HIGH (ref 11.5–15.5)
RDW: 16.7 % — ABNORMAL HIGH (ref 11.5–15.5)
RDW: 16.9 % — ABNORMAL HIGH (ref 11.5–15.5)
WBC: 10.6 10*3/uL — ABNORMAL HIGH (ref 4.0–10.5)
WBC: 5.7 10*3/uL (ref 4.0–10.5)
WBC: 6.6 10*3/uL (ref 4.0–10.5)
WBC: 7.2 10*3/uL (ref 4.0–10.5)

## 2010-09-21 LAB — VANCOMYCIN, TROUGH: Vancomycin Tr: 13.6 ug/mL (ref 10.0–20.0)

## 2010-09-21 LAB — HEPATIC FUNCTION PANEL
AST: 26 U/L (ref 0–37)
Bilirubin, Direct: 0.5 mg/dL — ABNORMAL HIGH (ref 0.0–0.3)
Indirect Bilirubin: 0.9 mg/dL (ref 0.3–0.9)
Total Protein: 7.2 g/dL (ref 6.0–8.3)

## 2010-09-21 LAB — IRON AND TIBC
Iron: 39 ug/dL — ABNORMAL LOW (ref 42–135)
Saturation Ratios: 32 % (ref 20–55)
UIBC: 83 ug/dL

## 2010-09-21 LAB — VITAMIN B12: Vitamin B-12: 1608 pg/mL — ABNORMAL HIGH (ref 211–911)

## 2010-09-21 LAB — MAGNESIUM
Magnesium: 1.5 mg/dL (ref 1.5–2.5)
Magnesium: 1.8 mg/dL (ref 1.5–2.5)

## 2010-09-21 LAB — PHOSPHORUS: Phosphorus: 2.6 mg/dL (ref 2.3–4.6)

## 2010-09-22 LAB — COMPREHENSIVE METABOLIC PANEL
ALT: 39 U/L (ref 0–53)
ALT: 53 U/L (ref 0–53)
AST: 49 U/L — ABNORMAL HIGH (ref 0–37)
Albumin: 1.9 g/dL — ABNORMAL LOW (ref 3.5–5.2)
BUN: 14 mg/dL (ref 6–23)
CO2: 28 mEq/L (ref 19–32)
CO2: 28 mEq/L (ref 19–32)
Calcium: 7.8 mg/dL — ABNORMAL LOW (ref 8.4–10.5)
Calcium: 8.1 mg/dL — ABNORMAL LOW (ref 8.4–10.5)
Creatinine, Ser: 0.88 mg/dL (ref 0.4–1.5)
GFR calc Af Amer: >60 mL/min (ref >60)
GFR calc non Af Amer: >60 mL/min (ref >60)
GFR calc non Af Amer: >60 mL/min (ref >60)
Glucose, Bld: 133 mg/dL — ABNORMAL HIGH (ref 70–99)
Sodium: 135 mEq/L (ref 135–145)
Sodium: 140 mEq/L (ref 135–145)

## 2010-09-22 LAB — CBC
HCT: 19.2 % — ABNORMAL LOW (ref 39.0–52.0)
HCT: 26.6 % — ABNORMAL LOW (ref 39.0–52.0)
Hemoglobin: 6.8 g/dL — CL (ref 13.0–17.0)
Hemoglobin: 7.6 g/dL — ABNORMAL LOW (ref 13.0–17.0)
Hemoglobin: 9.4 g/dL — ABNORMAL LOW (ref 13.0–17.0)
MCH: 36 pg — ABNORMAL HIGH (ref 26.0–34.0)
MCH: 36 pg — ABNORMAL HIGH (ref 26.0–34.0)
MCHC: 34.7 g/dL (ref 30.0–36.0)
MCHC: 35.3 g/dL (ref 30.0–36.0)
MCV: 101.6 fL — ABNORMAL HIGH (ref 78.0–100.0)
MCV: 101.9 fL — ABNORMAL HIGH (ref 78.0–100.0)
Platelets: 130 10*3/uL — ABNORMAL LOW (ref 150–400)
RBC: 1.89 MIL/uL — ABNORMAL LOW (ref 4.22–5.81)
RBC: 2.15 MIL/uL — ABNORMAL LOW (ref 4.22–5.81)
RDW: 14.8 % (ref 11.5–15.5)
WBC: 5.8 10*3/uL (ref 4.0–10.5)

## 2010-09-22 LAB — IRON AND TIBC
Iron: 102 ug/dL (ref 42–135)
Iron: 47 ug/dL (ref 42–135)
Saturation Ratios: 35 % (ref 20–55)
TIBC: 133 ug/dL — ABNORMAL LOW (ref 215–435)
UIBC: <55 ug/dL

## 2010-09-22 LAB — HEPATIC FUNCTION PANEL
ALT: 34 U/L (ref 0–53)
ALT: 53 U/L (ref 0–53)
AST: 109 U/L — ABNORMAL HIGH (ref 0–37)
AST: 42 U/L — ABNORMAL HIGH (ref 0–37)
Albumin: 2.3 g/dL — ABNORMAL LOW (ref 3.5–5.2)
Alkaline Phosphatase: 171 U/L — ABNORMAL HIGH (ref 39–117)
Alkaline Phosphatase: 93 U/L (ref 39–117)
Bilirubin, Direct: 0.7 mg/dL — ABNORMAL HIGH (ref 0.0–0.3)
Indirect Bilirubin: 1.3 mg/dL — ABNORMAL HIGH (ref 0.3–0.9)
Total Bilirubin: 2 mg/dL — ABNORMAL HIGH (ref 0.3–1.2)
Total Protein: 8.3 g/dL (ref 6.0–8.3)

## 2010-09-22 LAB — URINE MICROSCOPIC-ADD ON

## 2010-09-22 LAB — URINALYSIS, ROUTINE W REFLEX MICROSCOPIC
Glucose, UA: NEGATIVE mg/dL
pH: 6.5 (ref 5.0–8.0)

## 2010-09-22 LAB — BASIC METABOLIC PANEL
CO2: 27 mEq/L (ref 19–32)
Chloride: 107 mEq/L (ref 96–112)
Creatinine, Ser: 0.73 mg/dL (ref 0.4–1.5)
GFR calc Af Amer: >60 mL/min (ref >60)
Potassium: 3.1 mEq/L — ABNORMAL LOW (ref 3.5–5.1)

## 2010-09-22 LAB — URINE CULTURE
Culture  Setup Time: 201112130243
Culture: NO GROWTH

## 2010-09-22 LAB — CROSSMATCH
ABO/RH(D): O POS
Unit division: 0

## 2010-09-22 LAB — VITAMIN B12: Vitamin B-12: 1526 pg/mL — ABNORMAL HIGH (ref 211–911)

## 2010-09-22 LAB — CULTURE, BLOOD (ROUTINE X 2): Culture  Setup Time: 201112130117

## 2010-09-22 LAB — DIFFERENTIAL
Eosinophils Absolute: 0.1 10*3/uL (ref 0.0–0.7)
Eosinophils Absolute: 0.2 10*3/uL (ref 0.0–0.7)
Eosinophils Relative: 2 % (ref 0–5)
Lymphs Abs: 1.4 10*3/uL (ref 0.7–4.0)
Lymphs Abs: 2.2 10*3/uL (ref 0.7–4.0)
Monocytes Absolute: 0.4 10*3/uL (ref 0.1–1.0)
Monocytes Relative: 5 % (ref 3–12)
Neutro Abs: 5.6 10*3/uL (ref 1.7–7.7)
Neutrophils Relative %: 67 % (ref 43–77)

## 2010-09-22 LAB — FERRITIN
Ferritin: 2045 ng/mL — ABNORMAL HIGH (ref 22–322)
Ferritin: 2102 ng/mL — ABNORMAL HIGH (ref 22–322)

## 2010-09-22 LAB — HEMOGLOBIN AND HEMATOCRIT, BLOOD
HCT: 20.5 % — ABNORMAL LOW (ref 39.0–52.0)
HCT: 21 % — ABNORMAL LOW (ref 39.0–52.0)
HCT: 24.8 % — ABNORMAL LOW (ref 39.0–52.0)
Hemoglobin: 7.1 g/dL — ABNORMAL LOW (ref 13.0–17.0)
Hemoglobin: 8.7 g/dL — ABNORMAL LOW (ref 13.0–17.0)

## 2010-09-22 LAB — RAPID URINE DRUG SCREEN, HOSP PERFORMED: Benzodiazepines: NOT DETECTED

## 2010-09-22 LAB — TSH: TSH: 1.256 u[IU]/mL (ref 0.350–4.500)

## 2010-09-22 LAB — ABO/RH: ABO/RH(D): O POS

## 2010-09-22 LAB — FOLATE
Folate: 4.8 ng/mL
Folate: 4.9 ng/mL
Folate: 5 ng/mL

## 2010-09-22 LAB — AMMONIA: Ammonia: 16 umol/L (ref 11–35)

## 2010-09-26 ENCOUNTER — Emergency Department (HOSPITAL_COMMUNITY)
Admission: EM | Admit: 2010-09-26 | Discharge: 2010-09-26 | Disposition: A | Discharge status: Home / Self Care | Payer: Medicare Other | Attending: Emergency Medicine | Admitting: Emergency Medicine

## 2010-09-26 DIAGNOSIS — L0201 Cutaneous abscess of face: Secondary | ICD-10-CM | POA: Insufficient documentation

## 2010-09-26 DIAGNOSIS — L03211 Cellulitis of face: Secondary | ICD-10-CM | POA: Insufficient documentation

## 2010-09-26 DIAGNOSIS — I252 Old myocardial infarction: Secondary | ICD-10-CM | POA: Insufficient documentation

## 2010-09-26 DIAGNOSIS — I10 Essential (primary) hypertension: Secondary | ICD-10-CM | POA: Insufficient documentation

## 2010-09-27 LAB — COMPREHENSIVE METABOLIC PANEL
ALT: 59 U/L — ABNORMAL HIGH (ref 0–53)
ALT: 73 U/L — ABNORMAL HIGH (ref 0–53)
ALT: 29 U/L (ref 0–53)
AST: 78 U/L — ABNORMAL HIGH (ref 0–37)
Albumin: 2.2 g/dL — ABNORMAL LOW (ref 3.5–5.2)
Albumin: 2.4 g/dL — ABNORMAL LOW (ref 3.5–5.2)
Alkaline Phosphatase: 79 U/L (ref 39–117)
Alkaline Phosphatase: 91 U/L (ref 39–117)
Alkaline Phosphatase: 73 U/L (ref 39–117)
BUN: 9 mg/dL (ref 6–23)
CO2: 26 mEq/L (ref 19–32)
CO2: 30 mEq/L (ref 19–32)
CO2: 32 mEq/L (ref 19–32)
CO2: 22 mEq/L (ref 19–32)
Calcium: 7.7 mg/dL — ABNORMAL LOW (ref 8.4–10.5)
Calcium: 8.1 mg/dL — ABNORMAL LOW (ref 8.4–10.5)
Calcium: 8.3 mg/dL — ABNORMAL LOW (ref 8.4–10.5)
Chloride: 97 mEq/L (ref 96–112)
Creatinine, Ser: 0.7 mg/dL (ref 0.4–1.5)
Creatinine, Ser: 0.72 mg/dL (ref 0.4–1.5)
GFR calc Af Amer: >60 mL/min (ref >60)
GFR calc Af Amer: >60 mL/min (ref >60)
GFR calc non Af Amer: >60 mL/min (ref >60)
GFR calc non Af Amer: >60 mL/min (ref >60)
GFR calc non Af Amer: >60 mL/min (ref >60)
GFR calc non Af Amer: >60 mL/min (ref >60)
Glucose, Bld: 169 mg/dL — ABNORMAL HIGH (ref 70–99)
Glucose, Bld: 220 mg/dL — ABNORMAL HIGH (ref 70–99)
Glucose, Bld: 110 mg/dL — ABNORMAL HIGH (ref 70–99)
Potassium: 2.2 mEq/L — CL (ref 3.5–5.1)
Potassium: 3.2 mEq/L — ABNORMAL LOW (ref 3.5–5.1)
Potassium: 4.8 mEq/L (ref 3.5–5.1)
Sodium: 135 mEq/L (ref 135–145)
Sodium: 137 mEq/L (ref 135–145)
Total Bilirubin: 1.1 mg/dL (ref 0.3–1.2)
Total Bilirubin: 1.7 mg/dL — ABNORMAL HIGH (ref 0.3–1.2)
Total Bilirubin: 0.8 mg/dL (ref 0.3–1.2)
Total Protein: 6 g/dL (ref 6.0–8.3)
Total Protein: 6.4 g/dL (ref 6.0–8.3)

## 2010-09-27 LAB — GLUCOSE, CAPILLARY
Glucose-Capillary: 111 mg/dL — ABNORMAL HIGH (ref 70–99)
Glucose-Capillary: 161 mg/dL — ABNORMAL HIGH (ref 70–99)
Glucose-Capillary: 166 mg/dL — ABNORMAL HIGH (ref 70–99)
Glucose-Capillary: 175 mg/dL — ABNORMAL HIGH (ref 70–99)
Glucose-Capillary: 182 mg/dL — ABNORMAL HIGH (ref 70–99)
Glucose-Capillary: 234 mg/dL — ABNORMAL HIGH (ref 70–99)

## 2010-09-27 LAB — BASIC METABOLIC PANEL
BUN: 13 mg/dL (ref 6–23)
BUN: 9 mg/dL (ref 6–23)
BUN: 41 mg/dL — ABNORMAL HIGH (ref 6–23)
BUN: 54 mg/dL — ABNORMAL HIGH (ref 6–23)
CO2: 30 mEq/L (ref 19–32)
CO2: 20 mEq/L (ref 19–32)
CO2: 21 mEq/L (ref 19–32)
Calcium: 8.3 mg/dL — ABNORMAL LOW (ref 8.4–10.5)
Calcium: 9.4 mg/dL (ref 8.4–10.5)
Chloride: 97 mEq/L (ref 96–112)
Chloride: 99 mEq/L (ref 96–112)
Chloride: 107 mEq/L (ref 96–112)
Chloride: 110 mEq/L (ref 96–112)
Creatinine, Ser: 0.73 mg/dL (ref 0.4–1.5)
Creatinine, Ser: 0.88 mg/dL (ref 0.4–1.5)
Creatinine, Ser: 1.54 mg/dL — ABNORMAL HIGH (ref 0.4–1.5)
Creatinine, Ser: 3.1 mg/dL — ABNORMAL HIGH (ref 0.4–1.5)
GFR calc Af Amer: >60 mL/min (ref >60)
GFR calc Af Amer: >60 mL/min (ref >60)
GFR calc non Af Amer: >60 mL/min (ref >60)
GFR calc non Af Amer: >60 mL/min (ref >60)
GFR calc non Af Amer: 46 mL/min — ABNORMAL LOW (ref >60)
Glucose, Bld: 157 mg/dL — ABNORMAL HIGH (ref 70–99)
Glucose, Bld: 174 mg/dL — ABNORMAL HIGH (ref 70–99)
Glucose, Bld: 216 mg/dL — ABNORMAL HIGH (ref 70–99)
Glucose, Bld: 102 mg/dL — ABNORMAL HIGH (ref 70–99)
Glucose, Bld: 124 mg/dL — ABNORMAL HIGH (ref 70–99)
Potassium: 3 mEq/L — ABNORMAL LOW (ref 3.5–5.1)
Potassium: 3.1 mEq/L — ABNORMAL LOW (ref 3.5–5.1)
Potassium: 5.3 mEq/L — ABNORMAL HIGH (ref 3.5–5.1)
Sodium: 135 mEq/L (ref 135–145)
Sodium: 136 mEq/L (ref 135–145)
Sodium: 134 mEq/L — ABNORMAL LOW (ref 135–145)

## 2010-09-27 LAB — CBC
HCT: 29.8 % — ABNORMAL LOW (ref 39.0–52.0)
HCT: 30.3 % — ABNORMAL LOW (ref 39.0–52.0)
HCT: 33.1 % — ABNORMAL LOW (ref 39.0–52.0)
Hemoglobin: 10.4 g/dL — ABNORMAL LOW (ref 13.0–17.0)
Hemoglobin: 10.7 g/dL — ABNORMAL LOW (ref 13.0–17.0)
Hemoglobin: 11.3 g/dL — ABNORMAL LOW (ref 13.0–17.0)
Hemoglobin: 13.8 g/dL (ref 13.0–17.0)
Hemoglobin: 12.1 g/dL — ABNORMAL LOW (ref 13.0–17.0)
Hemoglobin: 13.7 g/dL (ref 13.0–17.0)
MCHC: 34.3 g/dL (ref 30.0–36.0)
MCHC: 35.3 g/dL (ref 30.0–36.0)
MCHC: 35.3 g/dL (ref 30.0–36.0)
MCHC: 35.4 g/dL (ref 30.0–36.0)
MCHC: 34 g/dL (ref 30.0–36.0)
MCHC: 34.6 g/dL (ref 30.0–36.0)
MCV: 108.8 fL — ABNORMAL HIGH (ref 78.0–100.0)
MCV: 109.1 fL — ABNORMAL HIGH (ref 78.0–100.0)
MCV: 110.1 fL — ABNORMAL HIGH (ref 78.0–100.0)
MCV: 108.9 fL — ABNORMAL HIGH (ref 78.0–100.0)
Platelets: 60 10*3/uL — ABNORMAL LOW (ref 150–400)
Platelets: 78 10*3/uL — ABNORMAL LOW (ref 150–400)
Platelets: 253 10*3/uL (ref 150–400)
RBC: 2.78 MIL/uL — ABNORMAL LOW (ref 4.22–5.81)
RBC: 3.01 MIL/uL — ABNORMAL LOW (ref 4.22–5.81)
RBC: 3.6 MIL/uL — ABNORMAL LOW (ref 4.22–5.81)
RBC: 3.27 MIL/uL — ABNORMAL LOW (ref 4.22–5.81)
RDW: 13.8 % (ref 11.5–15.5)
RDW: 14 % (ref 11.5–15.5)
RDW: 14.1 % (ref 11.5–15.5)
RDW: 12.8 % (ref 11.5–15.5)
WBC: 5.1 10*3/uL (ref 4.0–10.5)

## 2010-09-27 LAB — DIFFERENTIAL
Basophils Absolute: 0 10*3/uL (ref 0.0–0.1)
Basophils Absolute: 0 10*3/uL (ref 0.0–0.1)
Basophils Absolute: 0.1 10*3/uL (ref 0.0–0.1)
Basophils Relative: 1 % (ref 0–1)
Basophils Relative: 1 % (ref 0–1)
Eosinophils Absolute: 0 10*3/uL (ref 0.0–0.7)
Eosinophils Absolute: 0.5 10*3/uL (ref 0.0–0.7)
Lymphocytes Relative: 10 % — ABNORMAL LOW (ref 12–46)
Lymphocytes Relative: 8 % — ABNORMAL LOW (ref 12–46)
Lymphs Abs: 0.4 10*3/uL — ABNORMAL LOW (ref 0.7–4.0)
Lymphs Abs: 0.6 10*3/uL — ABNORMAL LOW (ref 0.7–4.0)
Lymphs Abs: 1.7 10*3/uL (ref 0.7–4.0)
Monocytes Absolute: 0.3 10*3/uL (ref 0.1–1.0)
Monocytes Absolute: 0.4 10*3/uL (ref 0.1–1.0)
Monocytes Absolute: 1 10*3/uL (ref 0.1–1.0)
Monocytes Relative: 4 % (ref 3–12)
Monocytes Relative: 5 % (ref 3–12)
Neutro Abs: 4.9 10*3/uL (ref 1.7–7.7)
Neutro Abs: 5.7 10*3/uL (ref 1.7–7.7)
Neutrophils Relative %: 79 % — ABNORMAL HIGH (ref 43–77)
Neutrophils Relative %: 87 % — ABNORMAL HIGH (ref 43–77)
Neutrophils Relative %: 56 % (ref 43–77)

## 2010-09-27 LAB — CARDIAC PANEL(CRET KIN+CKTOT+MB+TROPI)
CK, MB: 5.3 ng/mL — ABNORMAL HIGH (ref 0.3–4.0)
CK, MB: 6.7 ng/mL (ref 0.3–4.0)
CK, MB: 7.5 ng/mL (ref 0.3–4.0)
Relative Index: 4.1 — ABNORMAL HIGH (ref 0.0–2.5)
Relative Index: 4.8 — ABNORMAL HIGH (ref 0.0–2.5)
Total CK: 129 U/L (ref 7–232)
Total CK: 140 U/L (ref 7–232)
Troponin I: 0.58 ng/mL (ref 0.00–0.06)
Troponin I: 0.85 ng/mL (ref 0.00–0.06)
Troponin I: 1.33 ng/mL (ref 0.00–0.06)

## 2010-09-27 LAB — TROPONIN I: Troponin I: 0.76 ng/mL (ref 0.00–0.06)

## 2010-09-27 LAB — POCT I-STAT 3, ART BLOOD GAS (G3+)
Acid-base deficit: 1 mmol/L (ref 0.0–2.0)
Acid-base deficit: 1 mmol/L (ref 0.0–2.0)
Bicarbonate: 23.3 mEq/L (ref 20.0–24.0)
O2 Saturation: 99 %
TCO2: 24 mmol/L (ref 0–100)
pCO2 arterial: 38 mmHg (ref 35.0–45.0)
pO2, Arterial: 129 mmHg — ABNORMAL HIGH (ref 80.0–100.0)
pO2, Arterial: 187 mmHg — ABNORMAL HIGH (ref 80.0–100.0)

## 2010-09-27 LAB — POCT CARDIAC MARKERS
CKMB, poc: 4.7 ng/mL (ref 1.0–8.0)
CKMB, poc: 4.3 ng/mL (ref 1.0–8.0)
Myoglobin, poc: >500 ng/mL (ref 12–200)
Myoglobin, poc: >500 ng/mL (ref 12–200)
Troponin i, poc: 0.12 ng/mL — ABNORMAL HIGH (ref 0.00–0.09)
Troponin i, poc: 0.14 ng/mL — ABNORMAL HIGH (ref 0.00–0.09)

## 2010-09-27 LAB — TSH
TSH: 2.117 u[IU]/mL (ref 0.350–4.500)
TSH: 2.431 u[IU]/mL (ref 0.350–4.500)

## 2010-09-27 LAB — BRAIN NATRIURETIC PEPTIDE: Pro B Natriuretic peptide (BNP): 146 pg/mL — ABNORMAL HIGH (ref 0.0–100.0)

## 2010-09-27 LAB — POCT I-STAT, CHEM 8
BUN: 22 mg/dL (ref 6–23)
Calcium, Ion: 1.04 mmol/L — ABNORMAL LOW (ref 1.12–1.32)
Creatinine, Ser: 1 mg/dL (ref 0.4–1.5)
TCO2: 25 mmol/L (ref 0–100)

## 2010-09-27 LAB — URINALYSIS, ROUTINE W REFLEX MICROSCOPIC
Glucose, UA: NEGATIVE mg/dL
Ketones, ur: 15 mg/dL — AB
pH: 6 (ref 5.0–8.0)

## 2010-09-27 LAB — CULTURE, BLOOD (ROUTINE X 2): Culture: NO GROWTH

## 2010-09-27 LAB — MAGNESIUM
Magnesium: 1.3 mg/dL — ABNORMAL LOW (ref 1.5–2.5)
Magnesium: 2 mg/dL (ref 1.5–2.5)
Magnesium: 2.5 mg/dL (ref 1.5–2.5)
Magnesium: 1.7 mg/dL (ref 1.5–2.5)

## 2010-09-27 LAB — T4, FREE: Free T4: 1.17 ng/dL (ref 0.80–1.80)

## 2010-09-27 LAB — PHOSPHORUS
Phosphorus: 2.2 mg/dL — ABNORMAL LOW (ref 2.3–4.6)
Phosphorus: 2.8 mg/dL (ref 2.3–4.6)

## 2010-09-27 LAB — URINE MICROSCOPIC-ADD ON

## 2010-09-27 LAB — LACTIC ACID, PLASMA
Lactic Acid, Venous: 2.7 mmol/L — ABNORMAL HIGH (ref 0.5–2.2)
Lactic Acid, Venous: 5.4 mmol/L — ABNORMAL HIGH (ref 0.5–2.2)

## 2010-09-27 LAB — DIGOXIN LEVEL
Digoxin Level: 0.2 ng/mL — ABNORMAL LOW (ref 0.8–2.0)
Digoxin Level: 2.6 ng/mL (ref 0.8–2.0)

## 2010-09-27 LAB — CORTISOL: Cortisol, Plasma: 25.1 ug/dL

## 2010-09-27 LAB — CK TOTAL AND CKMB (NOT AT ARMC): Relative Index: 4.9 — ABNORMAL HIGH (ref 0.0–2.5)

## 2010-09-27 LAB — URINE CULTURE

## 2010-09-27 LAB — PROTIME-INR: Prothrombin Time: 14.1 seconds (ref 11.6–15.2)

## 2010-11-03 ENCOUNTER — Emergency Department (HOSPITAL_COMMUNITY)
Admission: EM | Admit: 2010-11-03 | Discharge: 2010-11-03 | Disposition: A | Discharge status: Home / Self Care | Payer: Medicare Other | Attending: Emergency Medicine | Admitting: Emergency Medicine

## 2010-11-03 ENCOUNTER — Emergency Department (HOSPITAL_COMMUNITY): Payer: Medicare Other

## 2010-11-03 DIAGNOSIS — Z85118 Personal history of other malignant neoplasm of bronchus and lung: Secondary | ICD-10-CM | POA: Insufficient documentation

## 2010-11-03 DIAGNOSIS — I10 Essential (primary) hypertension: Secondary | ICD-10-CM | POA: Insufficient documentation

## 2010-11-03 DIAGNOSIS — IMO0002 Reserved for concepts with insufficient information to code with codable children: Secondary | ICD-10-CM | POA: Insufficient documentation

## 2010-11-03 DIAGNOSIS — R000 Tachycardia, unspecified: Secondary | ICD-10-CM | POA: Insufficient documentation

## 2010-11-03 DIAGNOSIS — E039 Hypothyroidism, unspecified: Secondary | ICD-10-CM | POA: Insufficient documentation

## 2010-11-03 DIAGNOSIS — M25469 Effusion, unspecified knee: Secondary | ICD-10-CM | POA: Insufficient documentation

## 2010-11-03 DIAGNOSIS — W010XXA Fall on same level from slipping, tripping and stumbling without subsequent striking against object, initial encounter: Secondary | ICD-10-CM | POA: Insufficient documentation

## 2010-11-03 LAB — POCT I-STAT, CHEM 8
Creatinine, Ser: 1.1 mg/dL (ref 0.4–1.5)
Glucose, Bld: 131 mg/dL — ABNORMAL HIGH (ref 70–99)
Hemoglobin: 12.2 g/dL — ABNORMAL LOW (ref 13.0–17.0)
Potassium: 3.5 mEq/L (ref 3.5–5.1)

## 2010-11-24 NOTE — Discharge Summary (Signed)
NAME:  Ryan Brewer, Ryan Brewer NO.:  [id number]   MEDICAL RECORD NO.:  [id number]          PATIENT TYPE:  INP   LOCATION:  3738                         FACILITY:  [facility]   PHYSICIAN:  Altha Harm, MDDATE OF BIRTH:  [birth year]   DATE OF ADMISSION:  01/25/2008  DATE OF DISCHARGE:  02/03/2008                               DISCHARGE SUMMARY   DISCHARGE DISPOSITION:  Home.   FINAL DISCHARGE DIAGNOSES:  1. Aspiration pneumonia.  2. Alcohol withdrawal seizure.  3. Delirium tremens, resolved.  4. Alcoholic hepatitis.  5. Hypothyroidism.  6. 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  (The patient is not on ACE inhibitor or      beta-blocker secondary to marginal blood pressures).  7. Transiently elevated troponins.  8. History of lung cancer.  9. Hypotension post sedation intubation, resolved.  10.Hypokalemia, resolved.  11.Ventilator dependent respiratory failure, resolved.   DISCHARGE MEDICATIONS:  1. Include the following - Synthroid 50 mcg p.o. daily.  2. Plavix 75 mg p.o. daily.  3. Aspirin 81 mg p.o. daily.  4. Avelox 400 mg p.o. daily x2.  5. Thiamine 100 mg p.o. daily.  6. Folate 1 mg p.o. daily.  7. Zocor 20 mg p.o. daily.   MEDICATIONS ON HOLD:  Metoprolol 25 mg p.o. b.i.d. secondary to marginal  blood pressures.  (Although the patient has decreased systolic function  the patient is not on an ACE inhibitor secondary to low blood pressures  dropping into the 60s with the use of any blood pressure medications  presently).   CONSULTANTS:  1. Critical care management.  2. Cardiology.   PROCEDURES:  1. Endotracheal intubation.  2. Cardiac catheterization.  3. PICC line placement.   DIAGNOSTIC STUDIES:  1. Portable chest x-ray one view done on admission which shows      interstitial prominence felt to represent pulmonary edema.      Multifocal left-sided airspace disease most likely concurrent  infection.  Small right pleural effusion with possible right lung      base nodule or pleural irregularity.  2. CT angiogram of the chest done on admission which shows no evidence      of pulmonary embolus.  3. CT of the head without contrast done on admission which shows no      evidence of any intracranial abnormalities.  4. Portable chest x-ray done on July 18 which shows stable appearance      of the chest compared to last night's CT with scattered areas of      airspace opacity in the left mid lung.  5. Portable chest x-ray done on July 19 which shows significant      worsening of airspace disease versus acute edema.  6. Abdominal examination portable which shows respiratory motion      obscuring detail but the tip of the NG tube appears to be in the      distal antrum or duodenal bulb region.  7. Portable chest x-ray done on July 19 which showed left central      venous line  tip in the mid SVC.  No pneumothorax.  No change in      diffuse airspace disease.  8. Bilateral pneumonia shown on chest x-ray of July 20.  9. Portable chest x-ray on July 21 which shows interval decrease of      airspace opacity consistent with resolving diffuse pneumonitis or      pulmonary edema.  10.Portable chest x-ray done on July 22 which shows nasogastric tube      removed.  Asymmetric airspace disease may represent a combination      of postsurgical changes in this patient with a history of lung      cancer with superimposed mild residual asymmetric pulmonary edema.  11.A 2-D echocardiogram done on July 19 which shows left ventricular      systolic function moderately to markedly decreased with an ejection      fraction of 25-30%.  Severe hypokinesis of the mild distal septum,      anterior lateral wall and apex.  There is mild to moderate mitral      valvular regurgitation and the left atrium is mildly dilated.  12.Left heart cath which shows nonobstructive coronary artery disease.      Subtle  segmental wall motion abnormality with an overall normal      left ventricle ejection fraction and normal right heart pressures.   CHIEF COMPLAINT:  Dyspnea, decreased appetite, diffuse myalgias.   HISTORY OF PRESENT ILLNESS:  Please see the H and P dictated by Dr.  Flonnie Overman for details of the HPI.   HOSPITAL COURSE:  1. The patient was admitted with provisional diagnosis of possibly      aspiration pneumonia.  The patient was started on antibiotics and      given supportive care.  The patient had a deterioration leading to      acute ventilator dependent respiratory failure requiring      intubation.  The patient was then intubated endotracheally and      transferred to the ICU.  During this time the patient was managed      by the critical care physician where he was started on IV      antibiotics.  The patient was also ruled out for H1N1 virus with      nasal swabbing.  The patient has been started on Tamiflu which was      subsequently discontinued when the patient's H1N1 came back      negative.  The patient is completing a total of 10 days of      antibiotics and is to complete Avelox 400 mg p.o. daily x2      additional days to complete his 10 days.  The patient had clinical      improvement and is now without any requirement for oxygen and      breathing well.  The patient also had an escalation of his cardiac      enzymes upon admission to the hospital.  Cardiology was consulted      and a 2-D echocardiogram showed the patient with significant      systolic dysfunction with an ejection fraction of 20-35%.  Once the      patient was medically stable he underwent a cardiac catheterization      which showed results as noted above.  There was a recommendation      that the patient should be on beta-blockers and an ACE inhibitor.      However, the patient has had very marginal  blood pressures even      with the use of a beta-blocker with blood pressures dropping into      the 60s  systolic.  Thus the beta-blockers and the ACE inhibitor      have been withheld at this time until the patient has been shown to      maintain his blood pressures without any significant hypotension      with the use of these medications.  2. In terms of his alcohol withdrawal, the patient presented post      seizure which is likely due to the alcohol withdrawal.  The patient      went through detoxification here in the hospital and currently has      no signs and symptoms of this.  The patient has been counseled      against further alcohol use, particularly in light of the most      recent presentation and also with his cardiomyopathy which is      likely alcohol induced.  3. Hypothyroidism.  The patient was continued on his Synthroid while      hospitalized and continued with his 50 mcg daily.  Otherwise, the      patient has remained stable.   PHYSICAL EXAMINATION:  VITAL SIGNS:  Today the patient is afebrile.  His  vital signs are as follows:  Temperature is 98.3, heart rate 84,  respirations 18, systolic blood pressure 102/68 and O2 sats are 98% on  room air.  LUNGS:  Clear to auscultation.  CARDIOVASCULAR:  He has got a normal S1 and S2.  The patient is able to  ambulate without any significant escalation in his heart rates or drop  in his blood pressure.  ABDOMEN:  The abdomen is obese, soft, nontender, nondistended.  No  masses.  No hepatosplenomegaly noted.  EXTREMITIES:  Are without any clubbing, cyanosis or edema.   DIETARY RESTRICTIONS:  The patient should be on a heart-healthy diet.   PHYSICAL RESTRICTIONS:  Activity - increased gradually as tolerated.   DISCHARGE MEDICATIONS:  1. Synthroid 15 mcg p.o. daily.  2. Vitamin 1 tab p.o. daily.  3. Plavix 75 mg p.o. daily.  4. Aspirin 81 mg p.o. daily.  5. Avelox 400 mg p.o. daily x2 days.  6. Thiamine 100 mg p.o. daily.  7. Folate 1 mg p.o. daily.  8. Zocor 20 mg p.o. daily.   FOLLOWUP:  The patient is to follow up  with Dr. Gala Romney in the office  within 4 weeks and the patient should follow up with his primary care  physician at the [facility] hospital within a week.      Altha Harm, MD  Electronically Signed     MAM/MEDQ  D:  02/03/2008  T:  02/03/2008  Job:  [id number]   cc:   Bevelyn Buckles. Bensimhon, MD

## 2010-11-24 NOTE — Consult Note (Signed)
NAME:  Ryan Brewer, Ryan Brewer                 ACCOUNT NO.:  [id number]   MEDICAL RECORD NO.:  [id number]          PATIENT TYPE:  INP   LOCATION:  3308                         FACILITY:  [facility]   PHYSICIAN:  Blenda Nicely. Shadad        DATE OF BIRTH:  1946-02-15   DATE OF CONSULTATION:  01/26/2008  DATE OF DISCHARGE:                                 CONSULTATION   REQUESTING PHYSICIAN:  [facility] hospitalist team.   REASON FOR CONSULTATION:  History of lung cancer.   HISTORY OF PRESENT ILLNESS:  This is a pleasant 65 year old gentleman  currently residing in [place], but predates his primary care at [facility] under the care of Dr. Janyth Contes.  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  He  underwent a left video-assisted thorascopic surgery and thoracotomy and  left upper lobectomy with mediastinal lymph node dissection.  The  pathology from this particular operation showed a T2 N0 adenocarcinoma  of the lung that is case number [id number].  The patient did not really  have any oncology follow-up.  The patient did have a to hospitalization  back in September 2007 for Klebsiella urinary tract infection as well as  complicated by respiratory failure, pneumonia, ARDS, and sepsis, but  recovered from that and presented back on July 16 with complaints of  dyspnea, decreased appetite, myalgias, shortness of breath, and sweats,  and his work-up initially with a CT scan with a chest x-ray initially on  July 17, an interstitial pneumonia with pulmonary edema, left-sided  airspace disease likely infection, and a CT scan of the chest was done  earlier this morning with contrast showed a patchy left side opacities  concerning for an infection or aspiration.  There was also with a left  hilar adenopathy cause possible bronchial obstruction with a left  hepatic lesions that is nonspecific but suspicious for metastasis.  Of  note back in December 2008, the  patient did have a MRI of the abdomen  which showed 6 definable lesions on the liver which all consistent with  hemangioma were identified on the prior PET CT scan in April 2007.  They  are all unchanged.  Due to these radiographic findings, I was asked to  evaluate the patient.  He is currently in the intensive care unit under  the [facility] hospital team with consultation of primary critical care.  He is currently receiving IV antibiotic with Flagyl.  He is getting also  seizure prophylaxis and as well as EEG.  He was unable to give me a lot  of history.  A lot of history was provided by his a long-time  girlfriend.   REVIEW OF SYSTEMS:  He did not report any headaches, blurred vision,  double visions.  He did report some mental status and occasional  confusion.  He did not report any chest pain or orthopnea, and did  report some shortness of breath or difficulty breathing and cough.  Did  report some fevers and chills and night sweats and occasional weight  loss.  Did not report any nausea or vomiting, abdominal pain,  hematochezia, or melena.  Genitourinary no complaints.  Rest of review  of systems was unremarkable.   PAST MEDICAL HISTORY:  Significant for COPD, hypertension,  hypothyroidism, coronary artery disease, and history of respiratory  failure, and sepsis back in September 2007.   FAMILY HISTORY:  Unremarkable.   PAST SURGICAL HISTORY:  Status post lobectomy.   SOCIAL HISTORY:  He lives with his girlfriend in town.  He is a Cytogeneticist.  Predominantly, he is cared at the [facility] in term.  He  consumes a large amount of alcohol about a fifth of gin daily for  longest period of time.   No known allergies.   MEDICATIONS:  The patient is on albuterol, hydrochlorothiazide,  Synthroid, metoprolol, multivitamins, simvastatin, vitamin B12, and  medication in inpatient include K-Dur, potassium, methylprednisone which  was recently discontinued, metoprolol,  hydrochlorothiazide, Lovenox,  aspirin, Protonix, albuterol, ipratropium, guaifenesin, Senokot, Avelox,  Rocephin, and Flagyl.   PHYSICAL EXAMINATION:  Alert, awake.  Generally, more chronically-ill  appearing, did not appear in any physical distress today.  He was  getting an EEG at the time of my evaluation.  Heart rate of 102, blood  pressure 126/90, pulse is 80, respirations 18.  Head is normocephalic,  atraumatic.  Pupils are equal, round, and reactive to light.  Mucous  membranes are moist and pink.  Neck is supple.  Heart has a regular  rate.  Lungs are clear.  Subscattered wheezes and rhonchi.  Extremities  no edema.   LABORATORY DATA:  Phosphorus of less than 0.1, magnesium of 1.5,  potassium of 2.4, creatinine of 0.9.  His CBC showed a hemoglobin of 13,  white cells 14.7, platelet count of 168.   ASSESSMENT AND PLAN:  This is a pleasant 65 year old gentleman with  multiple comorbid conditions including polysubstance abuse, coronary  artery disease, diagnosis of early stage adenocarcinoma of the lung T2  N0, stage IA back t2o years ago.  Now presents with what appears to be  aspiration pneumonia and also developed an alcohol withdrawal seizure  and a CT scan showed some adenopathy predominantly in the mediastinal as  well as left hilar adenopathy again concerning for recurrence versus  reactive.  There is also an incidental finding of suspicious liver  lesion that has been described in an MRI back in December 2008 that was  hemangioma.   My plan will be at this point with degree of treatment underlying this  condition which is aspiration pneumonia, alcohol withdrawal, to begin  with IV antibiotics are likely to do in.  I recommend repeat imaging  studies.  Again, I agree with pulmonary to drain 4-6 weeks.  We will  arrange for followup to the [facility] upon his discharge,  and as for his liver lesion, again I will need to compare that new liver  lesion  described on his chest CT with his MRI that was done in December.  Again, if this is a newer enlarging liver lesion then we can certainly  biopsy it.  Again, right now I do not recommend any diagnostic work-up  until his infection have resolved.  Thank you for this consultation.           ______________________________  Blenda Nicely [facility]  Electronically Signed     FNS/MEDQ  D:  01/26/2008  T:  01/27/2008  Job:  [id number]   cc:   Salvatore Decent. Cornelius Moras, M.D.

## 2010-11-24 NOTE — Cardiovascular Report (Signed)
NAME:  Ryan Brewer, Ryan Brewer NO.:  [id number]   MEDICAL RECORD NO.:  [id number]          PATIENT TYPE:  INP   LOCATION:  3738                         FACILITY:  [facility]   PHYSICIAN:  Verne Carrow, MDDATE OF BIRTH:  1945-08-25   DATE OF PROCEDURE:  02/01/2008  DATE OF DISCHARGE:                            CARDIAC CATHETERIZATION   OPERATORS:  1. Bruce R. Juanda Chance, MD, [facility] (proctoring physician).  2. Verne Carrow, MD   PROCEDURES PERFORMED:  1. Left heart catheterization.  2. Right heart catheterization with pressures.  3. Selective coronary angiography.  4. Left ventricular angiogram.   INDICATIONS:  Mr. Fann is a 65 year old African-American male with a  history of nonobstructive coronary artery disease and nonischemic  cardiomyopathy who was admitted with pneumonia and developed a mild  elevation of his cardiac enzymes.   PROCEDURE:  The patient signed informed consent and was brought to the  catheterization laboratory, where he was prepped and draped in a sterile  fashion.  Arterial access was obtained through the right femoral artery  and the right femoral vein.  A pigtail catheter was inserted into the  left ventricle.  A Swan-Ganz catheter was inserted into the right atrium  and pressures were then measured in the right atrium, right ventricle,  pulmonary artery, and in a wedge position.  Left ventriculogram was then  obtained, followed by pull back across the aortic valves.   The left main, left anterior descending, and circumflex coronary  arteries were selectively injected.  The right coronary artery was then  selectively injected.  The anatomy of the coronary arteries is outlined  below.  The patient tolerated the procedure well and was taken to  recovery where his arterial and venous sheaths were removed.  Hemostasis  was obtained with manual pressure.  There were no complications during  the procedure.  The patient was in stable  condition at the time of  transport, out of the catheterization laboratory.   RESULTS:  1. Left heart catheterization showed normal left main, normal left      anterior descending, 20% plaque in the midportion of the circumflex      coronary artery, and serial 20-30% stenoses in the proximal and mid      right coronary artery.  There was no obstructive coronary artery      disease noted.   1. Left ventricular angiogram was obtained and showed an ejection      fraction of 60-65% with subtle hypokinesis at the bases including      the inferior base.   1. Right heart catheterization showed a right atrial pressure of 4,      right ventricular pressure of 21/3, pulmonary artery pressure of      22/8, mean pulmonary capillary wedge pressure of 8, and aortic      pressure of 84/60.   IMPRESSION:  1. Nonobstructive coronary artery disease.  2. Subtle segmental wall motion abnormality with an overall normal      left ventricular ejection fraction.  3. Normal right heart pressures.   RECOMMENDATIONS:  Continue the patient's current medications.  No new  recommendations at this time.      Verne Carrow, MD  Electronically Signed     CM/MEDQ  D:  02/01/2008  T:  02/02/2008  Job:  [id number]

## 2010-11-24 NOTE — Assessment & Plan Note (Signed)
[facility]                            CARDIOLOGY OFFICE NOTE   HRIDAY, STAI                        MRN:          [id number]  DATE:02/21/2008                            DOB:          [birth year]    PRIMARY CARE PHYSICIAN:  Dr. Francene Castle at the [facility].   PRIMARY CARDIOLOGIST:  Bevelyn Buckles. Bensimhon, MD, but once to follow up  with a cardiologist at the [facility].   This is a 65 year old African American male patient who was admitted to  the hospital with aspiration pneumonia and alcohol withdrawal seizure.  He ended up having an abnormal cardiac markers in the setting of  pneumonia and was seen by Dr. Gala Romney.  Technically, it was felt to  have a non-ST-elevation MI with abnormal electrocardiogram at baseline.  The patient apparently was felt to have an acute anterolateral MI in  2007.  Although, findings of essential normal flow of the LAD and focal  wall motion abnormality in the anterior apical distribution.  Medical  therapy was recommended at that time.  At this admission, he underwent 2-  D echo, which revealed moderate to markedly decreased ejection fraction  of 25-30% with severe hypokinesis with a mild distal septum, anterior  lateral wall and apex.  There was also mild to moderate MR and LA was  mildly dilated.  Interestingly enough, a cath 2 days later on January 30, 2008, revealed nonobstructive coronary artery disease, subtle segmental  wall motion abnormalities with overall normal LV function and normal  right heart pressures.  The patient's blood pressure remained low, so  beta-blockers and ACE inhibitors were not started and he was sent home.  The patient is doing pretty well.  He is avoiding alcohol.  He cannot  walk great distances because of dyspnea on exertion and history of lung  cancer with prior left pneumonectomy.  There is also possible recurrence  of the carcinoma with extensive adenopathy on CT.  The patient wants to  follow up with a cardiologist at the [facility] from here on now.   CURRENT MEDICATIONS:  1. Folate 1 mg daily.  2. Zocor 20 mg nightly.  3. Multivitamin daily.  4. Levothyroxine 0.05 mg daily.  5. Albuterol 2 puffs q.i.d.   PHYSICAL EXAMINATION:  GENERAL:  This is a thin elderly 65 year old  white African American male, in no acute distress.  VITAL SIGNS:  Blood pressure when the tech took it was 107/60 and when I  took, it was 97/68.  NECK:  Without JVD, HJR, bruit, or thyroid enlargement.  LUNGS:  Decreased breath sounds throughout and decreased at the right  base greater than the left.  No wheezing, rales, or rhonchi.  HEART:  Regular rate and rhythm at 100 beats per minute.  Normal S1 and S2.  Positive S4 and a 2/6 systolic ejection murmur at the left sternal  border.  ABDOMEN:  Soft without organomegaly, masses, lesions, or abnormal  tenderness.  EXTREMITIES:  Without cyanosis, clubbing, or edema and good distal  pulses.   IMPRESSION:  1. Aspiration pneumonia.  2. Alcohol  withdrawal seizure with delirium tremens.  3. Alcoholic hepatitis.  4. Non-ST-elevation myocardial infarction on admission with cardiac      cath revealing nonobstructive coronary artery disease with overall      normal left ventricular function and normal right heart pressures.      In summary, 2-D echo on January 28, 2008 revealed moderate to marked      decreased left ventricular function, ejection fraction 25-30% with      severe hypokinesis of the mild mid distal septum in anterolateral      wall and apex with mild to moderate mitral regurgitation.  5. History of lung cancer with questionable recurrence with extensive      adenopathy on CT.  6. History of hypertension, although has been hypotensive.  7. Chronic obstructive pulmonary disease.  8. Ventilator-dependent respiratory failure, resolved.  9. Hypothyroidism.   PLAN:  I have given the patient prescription for Coreg 6.25 mg b.i.d.  because of his  tachycardia.  He does not want to follow up here any  more, and will follow up with the cardiologist at the [facility].  They may want  to repeat a 2-D echo to reevaluate his LV functions because of the  discrepancy between it and the cardiac catheterization done 2 days  apart.       Jacolyn Reedy, PA-C  Electronically Signed      Luis Abed, MD, [facility]  Electronically Signed   ML/MedQ  DD: 02/21/2008  DT: 02/21/2008  Job #: [id number]   cc:   Dr. Francene Castle

## 2010-11-24 NOTE — Procedures (Signed)
EEG NUMBER:  [phone]   CLINICAL HISTORY:  The patient is a 65 year old admitted with dyspnea,  decreased appetite, myalgias, shortness of breath with possible seizure  activity.  The study is being done to look for the presence of seizures  (780.39).   PROCEDURE:  The tracing is carried out on a 32-channel, digital Cadwell  recorder reformatted into 16-channel montages with one devoted to EKG.  The patient was awake and asleep during the recording.  The  international 10/20 system of lead placement was used.   MEDICATIONS:  Tylenol, Ventolin, aspirin, Rocephin, Lovenox, Synthroid,  Ativan, Lopressor, Avelox, Protonix, K-Dur, potassium chloride, and  Zocor.   DESCRIPTION OF FINDINGS:  Dominant frequency is a 20 microvolt, 4-5 Hz  activity.  The patient drifts into natural sleep with appearance of  vertex sharp waves and spindles in a polymorphic delta range background.  There was no response of photic stimulation.  Hyperventilation could not  be carried out.  EKG showed a sinus tachycardia with ventricular  response of 102 beats per minute.   IMPRESSION:  Abnormal EEG on the basis of diffuse background slowing.  This is a nonspecific indicator of neuronal dysfunction maybe on a  primary degenerative basis or secondary to a variety of toxic metabolic  etiologies.  The findings require correlation with the patient's  clinical contacts.      Deanna Artis. Sharene Skeans, M.D.  Electronically Signed     MVH:QION  D:  01/26/2008 62:95:28  T:  01/27/2008 05:08:52  Job #:  [id number]   cc:   [employer]

## 2010-11-24 NOTE — H&P (Signed)
NAME:  Ryan Brewer NO.:  [id number]   MEDICAL RECORD NO.:  [id number]          PATIENT TYPE:  INP   LOCATION:  1823                         FACILITY:  [facility]   PHYSICIAN:  Lucita Ferrara, MD         DATE OF BIRTH:  03-02-1946   DATE OF ADMISSION:  01/25/2008  DATE OF DISCHARGE:                              HISTORY & PHYSICAL   PRIMARY CARE DOCTOR:  Unassigned at [facility]   HISTORY OF PRESENT ILLNESS:  The patient is a 65 year old who presents  with a chief complaint of dyspnea, decreased appetite, diffuse myalgias  and shortness of breath.  The symptoms were worse upon exertion and  ambulation.  His symptoms were relieved with oxygen per nasal cannula  and rest.  The patient is obviously diaphoretic now for the last 48  hours, and he has fevers and chills.  He denies any urinary symptoms.  He also has pleuritic chest pain, but no chest tightness.  The shortness  of breath is not accompanied by lower extremity swelling, orthopnea or  paroxysmal nocturnal dyspnea.  He does have a significant history of  lung cancer, status post pneumonectomy.  The patient denies recent  travel, recent sick contacts or exposures to individuals with known  swine flu or influenza virus.   PAST MEDICAL HISTORY:  1. Chronic obstructive pulmonary disease.  He is not home O2      dependent, however.  2. He also had left lingular adenocarcinoma as above, stage I.  He had      a left upper lobe lobectomy in 2007.  3. Hypertension.  4. Hypothyroidism.  5. Coronary artery disease, status post cardiac catheterization      performed on March 08, 2006, which showed a left ventricular wall      motion abnormality in the absence of significant obstructive      coronary disease.  Status post acute anterolateral ST-segment      elevation myocardial infarction, September 2007.  6. History of vent dependent respiratory failure and history of adult      respiratory distress syndrome,  dated September 2007.  7. History of Klebsiella urinary tract infection, again in September      2007.   FAMILY HISTORY:  Noncontributory.   PAST SURGICAL HISTORY:  As above, cardiac catheterization and lobectomy  of the lung.   SOCIAL HISTORY:  Former smoker.  Denies drugs, alcohol or tobacco  currently.   ALLERGIES:  NO KNOWN DRUG ALLERGIES.   MEDICATIONS AT HOME:  1. Albuterol.  2. Hydrochlorothiazide.  3. Levothyroxine.  4. Metoprolol.  5. Multivitamin.  6. Simvastatin.  7. Vitamin B12.   REVIEW OF SYSTEMS:  As per HPI, otherwise negative   PHYSICAL EXAMINATION:  GENERAL:  The patient is in moderate distress  secondary to shortness of breath.  VITAL SIGNS:  Blood pressure is 117/77, pulse 135, respirations 18,  temperature 97.2.  HEENT:  Normocephalic, atraumatic.  Mucous membranes somewhat dry.  PERLA external muscles intact.  NECK:  Supple.  No JVD, no carotid bruits.  CARDIOVASCULAR:  S1-S2, tachy.  LUNGS:  The patient has decreased breath sounds bilaterally.  Diffuse  expiratory wheezes.  ABDOMEN:  Soft, nontender, nondistended.  Positive bowel sounds.  Patient is alert and oriented x3.  NEUROLOGIC:  Cranial nerves II  through XII are grossly intact.   Chest x-ray shows interstitial prominence, felt to represent pulmonary  edema.  More focal left-sided airspace disease, most likely a concurrent  infection given the patient's history, including prior left sided lung  cancer and partial pneumonectomy.  Alternate etiologies including  recurrent lymphangitic tumor should be considered.  There is a small  right pleural effusion, possible right lung base nodule or pleural  irregularity, consider PA and lateral radiological follow-up after the  patient is clinically improved.   LABORATORY DATA:  Brain natriuretic peptide is 171.  The patient's  sodium is 133, potassium 2.0.  His carbon dioxide is 28, chloride is 89,  BUN is 9, creatinine of 0.93.  First set of  cardiac enzymes negative.  White count 14.7, hemoglobin 13.1, hematocrit 37.8, MCV is 108,  platelets 158.   1. CT angio shows patchy left-sided opacity with either secretion or      aspiration, subtenting bronchitis, with findings suspicious of      infection or aspiration.  2. Findings suspicious for recurrent metastatic disease with extensive      adenopathy throughout the chest, left hilar adenopathy may be      causing bronchial obstruction in the remaining left lung.  3. Left hepatic lobe lesion which is nonspecific, but suspicious for      metastatic given probable absence on the prior exam, this a      superimposed upon severe fatty infiltration of the liver.  PET scan      recommended.   ASSESSMENT/PLAN:  A 65 year old with;  1. Fevers/chills/shortness of breath and left-sided pneumonia versus      recurrent lung cancer.  2. History of left lingual lung cancer, status post left      pneumonectomy.  3. CT angio showing probable recurrent carcinoma with extensive      adenopathy.  4. Left hepatic lobe lesion - new, rule out metastatic disease.  5. History of coronary artery disease, status post myocardial      infarction, ST elevation March 15, 2006.  6. Severe chronic obstructive pulmonary disease.   PLAN:  We will go ahead and admit the patient to the intensive care  unit.  Will monitor pulse oximetry.  Will get stat ABG.  Nebulizer  treatments of Atrovent and albuterol.  Start broad-spectrum antibiotics  with vancomycin, Avelox and Zosyn.  Blood cultures, sputum culture,  urine culture, H1N1 protocol.  Will initiate Tamiflu empirically at 75  b.i.d.  Metastatic workup once the patient is more stable with PET scan  and CT scan of the abdomen and pelvis.  Involvement of critical care  medicine for respiratory failure.  The rest of the plans are dependent  on his progress.  DVT prophylaxis with Lovenox and GI prophylaxis with  Protonix.     Lucita Ferrara, MD   Electronically Signed    RR/MEDQ  D:  01/26/2008  T:  01/26/2008  Job:  [id number]

## 2010-11-24 NOTE — Consult Note (Signed)
NAME:  Ryan Brewer, Ryan Brewer NO.:  [id number]   MEDICAL RECORD NO.:  [id number]          PATIENT TYPE:  INP   LOCATION:  3308                         FACILITY:  [facility]   PHYSICIAN:  Jonelle Sidle, MD DATE OF BIRTH:  [birth year]   DATE OF CONSULTATION:  DATE OF DISCHARGE:                                 CONSULTATION   REQUESTING SERVICE:  [facility].   CARDIOLOGIST:  Bevelyn Buckles. Bensimhon, MD.   REASON FOR CONSULTATION:  Abnormal cardiac markers in the setting of  pneumonia.   HISTORY OF PRESENT ILLNESS:  Ryan Brewer is a 65 year old male admitted  to the hospital with progressive shortness of breath associated with  myalgias, anorexia, subjective fevers and chills.  He has had a  pleuritic chest discomfort on the left, but no obvious angina and has  been diagnosed with pneumonia.  He underwent a CT scan of the chest on  January 26, 2008 demonstrating no pulmonary embolus.  There were patchy  left-sided opacities suspicious for infiltrate.  There was also  extensive adenopathy throughout the chest concerning for recurrent lung  cancer/metastasis particularly based on the patient's history of same.  He had left hilar adenopathy with the possibility of some bronchial  obstruction on the left and a left hepatic lobe lesion that was  nonspecific, but also concerning given the history.  He has been seen by  both pulmonary medicine and oncology, and is being treated with  antibiotics for a presumed infectious infiltrate at this time.  Other  additional studies are being considered for further evaluation.  His  clinical course was complicated by a witnessed seizure yesterday.  He is  undergoing evaluation for this as well.  He has not had any obvious  chest pain during his stay.  His electrocardiogram done earlier today  demonstrated sinus tachycardia with anterolateral ST/T wave changes,  specifically ST elevation in the V2-V4 leads which have been noted in  the past  and are actually less pronounced.  The patient's cardiac  history is interesting in that he presented back in August 2007 with  symptoms concerning for an acute anterolateral ST elevation myocardial  infarction, although diagnostic cardiac catheterization at that time  demonstrated only mild coronary plaque with normal flow within the left  anterior descending.  He did have akinesis of the anterior apex and  periapical portion of the ventricle.  Etiology was not entirely clear,  although the possibility of transient thrombosis, spasm or perhaps even  focal myocarditis were considered.  Ultimately, medical therapy was the  course of action.  I do not see that he has followed up with our  practice since that time.   ALLERGIES:  NO KNOWN DRUG ALLERGIES.   PRESENT MEDICATIONS:  1. Aspirin 81 mg p.o. daily.  2. Lovenox 40 mg subcu. daily.  3. Ensure 237 mL p.o. b.i.d.  4. Mucinex 600 mg p.o. b.i.d.  5. Atrovent and albuterol metered-dose inhaler treatments.  6. Synthroid 50 mcg p.o. daily.  7. Ativan p.r.n. and q.8 h. anxiety.  8. Lopressor 12.5 mg p.o. b.i.d.  9.  Flagyl 500 mg IV q.8 h.  10.Avelox 400 mg IV q.h.s.  11.Multivitamin daily.  12.Protonix 40 mg p.o. daily.  13.Zocor 20 mg p.o. q.h.s.  14.Senokot 1 tablet p.o. q.h.s.  15.Thiamine 100 mg IV daily.  16.Vancomycin protocol.   PAST MEDICAL HISTORY:  Past medical history is fairly complicated.  The  patient has oxygen dependent chronic obstructive pulmonary disease and  prior history of left lingular adenocarcinoma, stage I status post  lobectomy in 2007.  He has history of hypertension, hypothyroidism and  cardiovascular disease as detailed above with no history of previous  coronary intervention.  His left ventricular ejection fraction by  echocardiography in 2007 was overall 50-55% with periapical hypokinesis.  The patient had a hospitalization also in 2007 for Klebsiella urinary  tract infection complicated by  respiratory failure, pneumonia, ARDS and  sepsis, but ultimately recovered.  He had a prior MRI of the abdomen in  December 2008 which identified hepatic hemangiomas.   FAMILY HISTORY:  Reviewed and is noncontributory at this point.   SOCIAL HISTORY:  The patient is a former tobacco user.  He denies any  illicit substance or alcohol use.  He presently resides in [place].   REVIEW OF SYSTEMS:  As outlined above, otherwise negative.   PHYSICAL EXAMINATION:  VITAL SIGNS:  T-max recently 99.9 degrees, heart  rate has been in the 90-100s in sinus rhythm, systolic blood pressure  ranging from 90s to low 100s, oxygen saturation has been in the mid 90s  to 100% on 2 liters nasal cannula, intake was more than output by 2  liters over the last 24 hours.  GENERAL:  This is a chronically ill-appearing male lying in bed,  somnolent after receiving recent dose of Ativan.  HEENT:  Conjunctivae and lids are grossly normal.  Oropharynx clear.  Poor dentition. NECK:  Supple.  No elevated jugular venous pressure or  loud bruits.  LUNGS:  Exhibit coarse rhonchorous breath sounds with egophony in the  left mid-lower lung zones, mild expiratory wheezes.  CARDIAC:  Reveals a regular rate and rhythm.  No obvious S3 gallop or  pericardial rub is evident.  ABDOMEN:  Soft, nontender.  Normoactive bowel sounds.  EXTREMITIES:  Exhibit no significant pitting edema.  Distal pulses are  1+.  SKIN:  Warm and dry.  MUSCULOSKELETAL:  No kyphosis noted.  NEUROPSYCHIATRIC:  The patient somnolent after sedation, but oriented.   LABORATORY DATA:  WBCs 14.2, hemoglobin 11.8, hematocrit 33.7, platelets  176, sodium 137, potassium 3.5, chloride 99, bicarb 29, glucose 103, BUN  7, creatinine 0.7, total bilirubin 1.8, direct bilirubin 0.9, indirect  bilirubin 0.9, alkaline phosphatase 123, AST 181, ALT 94, albumin is  2.2, magnesium 1.7.  Cardiac markers mildly abnormal with a peak  troponin-I of 0.27 down to 0.17,  CK-MB peak was 4.6 down to 2.4 with  total CK levels being entirely normal, BMP was 171 at presentation.  The  patient presently on droplet isolation with several cultures pending.   IMPRESSION:  1. Minor increase in troponin-I level, technically a type 2 non-ST      elevation myocardial infarction with abnormal electrocardiogram at      baseline.  Mr. Limb cardiac history is interesting in that he      presented with what was felt to be an acute anterolateral      myocardial infarction back in 2007, although with findings of      essentially normal flow in the left anterior descending and a focal  wall motion abnormality in the anterior apical distribution.      Medical therapy was recommended at that point and clear etiology is      not certain with possibilities including transient thrombosis,      spasm and perhaps even focal myocarditis being considered at that      time.  I suspect his abnormal markers at this time are nonspecific      in the setting of other active comorbid illness rather than a frank      acute coronary syndrome and medical therapy would be most      appropriate at this time.  2. Pneumonia with history of lung cancer as detailed above, workup      ongoing.   RECOMMENDATIONS:  At this point, would manage the patient medically from  a cardiac perspective.  Agree with aspirin and DVT prophylaxis dose of  enoxaparin.  Lopressor is also a reasonable choice, although I suspect  his heart rate will be difficult to control until other active issues  stabilize.  He is not reporting any frank anginal chest pain at this  time.  He is on a statin medication which is certainly reasonable  although with his liver function abnormalities, if this needs to be  held, that can be resumed at a later time.  Agree with a follow up  echocardiogram to reassess left ventricular function and wall motion.  At this point, do not anticipate any invasive cardiac evaluation unless   the situation changes.  Our service will follow with you.      Jonelle Sidle, MD  Electronically Signed     SGM/MEDQ  D:  01/27/2008  T:  01/27/2008  Job:  [id number]   cc:   Bevelyn Buckles. Bensimhon, MD  [facility]

## 2010-11-27 NOTE — Discharge Summary (Signed)
NAME:  Ryan Brewer, Ryan Brewer                 ACCOUNT NO.:  [id number]   MEDICAL RECORD NO.:  [id number]          PATIENT TYPE:  INP   LOCATION:  3308                         FACILITY:  [facility]   PHYSICIAN:  Salvatore Decent. Cornelius Moras, M.D. DATE OF BIRTH:  1946/06/19   DATE OF ADMISSION:  12/03/2005  DATE OF DISCHARGE:  12/09/2005                                 DISCHARGE SUMMARY   ADMITTING DIAGNOSES:  Lingular lung mass.   PAST MEDICAL HISTORY AND DISCHARGE DIAGNOSES:  Lingular lung mass status  post left video-assisted thorascopic surgery, mini thoracotomy, and left  upper lobectomy with lymph node dissection.   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   BRIEF HISTORY:  Patient is a 65 year old African-American male with a long-  standing history of tobacco abuse who had not seen a doctor in over 10  years.  He recently presented to an urgent care for a routine physical  examination.  At that time he stated he felt well and had no chest pain or  shortness of breath.  Routine chest x-ray revealed a spiculated lingular  lung mass.  The patient was then referred to [facility] for further evaluation and  treatment.  His CT scan was then performed on October 19, 2005 which showed a  2.8 x 1.7 cm x 2.5 cm spiculated mass in the lingular segment of the left  lung.  There was no axillary, mediastinal, or hilar adenopathy noted.  Secondary to these findings it was Dr. Sharee Pimple opinion that the patient  would likely need to proceed with left upper lobectomy for resection of this  suspicious mass.  However, the patient's pulmonary function testing was  marginal.  Subsequent PET scan revealed an increased uptake within the  lingular mass as well as within bilateral hilar lymph nodes and some  anterior mediastinal lymph nodes.  There was also increased uptake noted in  AP window lymph nodes.  Given these findings it was Dr. Sharee Pimple opinion  that it would be  best to proceed with a bronchoscopy and mediastinoscopy to  rule out mediastinal lymph node metastasis before considering proceeding  with thoracotomy and lobectomy.   The patient then underwent flexible fiberoptic bronchoscopy and  mediastinoscopy with lymph node biopsies on Nov 22, 2005.  The patient was  then seen in follow-up by Dr. Cornelius Moras, Dr. Sharee Pimple partner on Nov 30, 2005.  His bronchoscopy was normal and all his mediastinal lymph nodes were  negative.  Therefore, the patient has been scheduled to proceed with left  VATS and left upper lobectomy.   HOSPITAL COURSE:  Patient was admitted and taken to the OR on Dec 03, 2005  for a left video-assisted thorascopic surgery, mini thoracotomy, left upper  lobectomy, and lymph node dissection.  The nurses were unable to place a  Foley catheter during the procedure and therefore a urology consult was  obtained.  Dr. Logan Bores of the urology service evaluated the patient  intraoperatively and placed a Foley without difficulty.  The patient  tolerated the procedure well, was hemodynamically  stable immediately  postoperatively.  He was transferred from the OR to the postanesthesia care  unit in stable condition.  Patient was extubated without complication and  woke up from anesthesia neurologically intact.   The patient's postoperative course has progressed as expected.  The urology  service reevaluation the patient and stated that since there was no history  of any previous urologic problems the Foley could be discontinued whenever  necessary.  This was done and the patient has been voiding without  difficulty since that time.  The patient's postoperative course has been  dedicated to his ambulation and chest tube removal.  His chest x-ray has  remained stable throughout the postoperative course with only a 10-15% left  lateral pneumothorax remaining.  On postoperative day one his chest tubes  were left in place and he was ambulated.    Postoperative day two his anterior chest tube was discontinued without  difficulty.  He was not noted to have an air leak at that time.  He was  afebrile with stable vital signs.  He continues to progress well; however,  on postoperative day four he was noted to have a very small air leak and  therefore the chest tube was left in place.  However, it was switched to  water seal.  He remained stable and his chest x-ray remained stable.  On  postoperative day five the chest tube was left in place for a question of  intermittent versus apical space.  There was minimal chest tube drainage.  Therefore, on postoperative day six the patient's remaining chest tube was  discontinued, again, without difficulty.  Follow-up chest x-ray status post  chest tube removal is pending at this time.  Final pathology results were  discussed with patient which were notable for a T1 N0 stage IA  adenocarcinoma of the lung moderately differentiated.  It did extend to the  visceral pleura, but not through.  All margins were negative and all lymph  nodes were negative.  The patient on postoperative day five is afebrile with  stable vital signs, maintaining normal sinus rhythm.  His chest x-ray is  stable.  He feels well.  On physical examination his lungs are clear to  auscultation.  Cardiac is regular rate and rhythm.  Patient is in stable  condition at this time.  As long as he continues to progress in the current  manner should be ready for discharge in the next one to two days pending  morning round reevaluation and that his chest x-ray, again, remains stable.   LABORATORIES:  CBC and BMP on Dec 07, 2005:  White count 7.4, hemoglobin 10,  hematocrit 30, platelets 306.  Sodium 136, potassium 4.3, BUN 11, creatinine  0.8, glucose 116.   CONDITION ON DISCHARGE:  Good, stable.   INSTRUCTIONS:  ACTIVITY:  The patient should refrain from driving for one week, no lifting  for two weeks.  He should continue his  daily breathing and walking  exercises.   DIET:  No restrictions.   WOUND CARE:  The patient should shower daily and clean incision with soap  and water.  If wound problems arise patient should contact CVTS office.   MEDICATIONS:  Tylox one to two q.4-6h. p.r.n. pain.   FOLLOW-UP APPOINTMENT:  1.  With [facility] on December 27, 2005 at 1:30 p.m.  2.  With Dr. Cornelius Moras on December 27, 2005 at 2:30 p.m.      Pecola Leisure, PA  Salvatore Decent. Cornelius Moras, M.D.  Electronically Signed    AY/MEDQ  D:  12/09/2005  T:  12/09/2005  Job:  [id number]

## 2010-11-27 NOTE — Discharge Summary (Signed)
NAME:  Ryan Brewer, Ryan Brewer                 ACCOUNT NO.:  [id number]   MEDICAL RECORD NO.:  [id number]          PATIENT TYPE:  INP   LOCATION:  6727                         FACILITY:  [facility]   PHYSICIAN:  Chauncey Reading, D.O.  DATE OF BIRTH:  09/16/1945   DATE OF ADMISSION:  05/14/2008  DATE OF DISCHARGE:  05/17/2008                               DISCHARGE SUMMARY   DISCHARGE DIAGNOSES:  1. Left lower lobe pneumonia.  2. Chronic obstructive pulmonary disease.  3. History of lung cancer status post left upper lobectomy, hilar      adenopathy on recent computed tomography.  4. Hypertension.  5. Hypothyroidism.  6. Coronary artery disease status post myocardial infarction x2.  7. Congestive heart failure.  8. Nutritional deficiency.   DISCHARGE MEDICATIONS:  1. Avelox 400 mg 1 tablet daily for 11 days.  2. Guaifenesin 200 mg 1 tablet every 6 hours as needed for cough.  3. Synthroid 50 mcg 1 tablet daily.  4. Plavix 75 mg 1 tablet daily.  5. Aspirin 81 mg 1 tablet daily.  6. Zocor 20 mg 1 tablet daily.  7. Tamiflu 75 mg 1 tablet twice daily for 2 days.  8. Multivitamin take 1 tablet daily.   DISPOSITION AND FOLLOWUP:  Dr. Clovia Cuff at [facility] on June 13, 2008.  Please follow up on blood pressure and adenopathy on CT.   PROCEDURES:  1. Chest x-ray:  Postop changes of left lung, pneumonia of left lung.  2. CT:  Interval resolution of bihilar adenopathy, some improvement in      mediastinal adenopathy, left-sided pulmonary infiltrate.   CONSULTATIONS:  Nutrition.   BRIEF ADMITTING HISTORY AND PHYSICAL:  This is a 65 year old male with  past medical history of lung cancer status post lobectomy, COPD, CHF,  CAD, and a former tobacco user, who presents with shortness of breath,  orthopnea, and cough x4 weeks.  The patient reports these symptoms have  gradually increased over the last few weeks.  His cough produces a  yellow sputum.  He denies sick contacts, fevers, chills, chest pain,  hemoptysis, recent travel, hematuria, melena, hematochezia, weight loss  or gain, leg swelling, or decreased exercise tolerance from his  baseline.   PHYSICAL EXAMINATION:  VITAL SIGNS:  Temperature was 101.0, blood  pressure 105/64, pulse was 115, respirations was 22, and he was sating  95% on room air.  GENERAL:  Thin appearing, but in no apparent distress.  HEENT:  His eyes are significant for arcus senilis.  Pupils equally  reactive and accommodating to light.  Extraocular muscles intact.  ENT,  moist mucous membranes.  No erythema.  NECK:  Supple.  No JVD.  RESPIRATORY:  No wheezing, rhonchi, or rales.  CARDIOVASCULAR:  Tachycardia, regular rhythm.  Normal S1, S2.  2+  pulses.  GI:  Soft, nontender, nondistended.  Positive bowel sounds.  No rebound  or guarding.  No hepatojugular reflex.  EXTREMITIES:  No edema.  GU:  No CVA tenderness.  SKIN:  No jaundice.  LYMPHATICS:  No lymph nodes.  MS:  Spontaneously moving all 4 extremities.  NEUROLOGIC:  Alert and oriented x3.  Nonfocal exam.  PSYCHIATRIC:  Appropriate.   ADMITTING LABORATORY DATA:  White blood count was 13.3, hemoglobin 9.8,  hematocrit 28.6, platelets 298, MCV was 95.  Sodium was 137, potassium  2.8, chloride. 105, bicarb 28, BUN 8, creatinine 0.89, glucose was 120,  magnesium was 1.8.  An iSTAT blood gas showed a pH of 7.528, PCO2 of  23.9, bicarb of 19.8, TCO2 of 20, and O2 saturation of 94.   HOSPITAL COURSE:  1. Left lower lobe pneumonia.  This is most likely community-acquired      pneumonia.  Sputum cultures grew gram-positive cocci in clusters      and in pairs and few gram-negative rods.  Azithromycin and Rocephin      were given IV.  The patient also received Tamiflu.  He remained      stable throughout his hospitalization.  He was afebrile after      initial presentation and his vital signs normalized within 12      hours.  His white blood cells trended down daily.  On the day of      discharge, he was  clinically improved, but had leukocytosis, but      this is likely due to steroids he received.  Blood cultures were      negative.  He was discharged on Avelox to continue for 11 more      days.  2. COPD.  On day 2 of hospitalization, the patient had wheezing on      exam.  Therefore, a course of steroids was initiated.  We      continued, scheduled, and p.r.n. albuterol and Atrovent and his      home O2 of 2 L.  Otherwise, the patient was treated as above.  He      has chornic bronchitis as evidenced by significant mucus      accumulation seen on CT as well as copiuos green mucus produced      with cough.  He would benifit from chest physical therapy and      percussion to relive his mucus burden.  3. History of lung cancer.  During a prior hospitalization for      pneumonia in July 2009, a CT noted hilar adenopathy.  This is      concerning given his history of lung cancer.  A repeat CT of the      chest on May 16, 2008, showed overall that the adenopathy has      improved.  Ryan Brewer will follow up with his primary doctor in 1      month and at that time, we will arrange followup with his      oncologist.  4. Hypertension.  The patient had a low normal blood pressures on      admission and therefore, his hydrochlorothiazide was held.  His      blood pressures remained low, so his antihypertensives were held      until he follows up with his primary doctor in 1 month.  5. Hypothyroidism.  He was continued on his home dose of Synthroid.      His TSH was 0.34.  6. Coronary artery disease.  The patient was chest free throughout his      stay, but we monitored him on telemetry.  Aspirin and Plavix were      continued.  Cardiac enzymes were negative x3.  The patient was      discharged on Zocor.  7. CHF with systolic dysfunction.  The patient remained stable      throughout the hospitalization.  By exam and chest x-ray, it was      unlikely that a CHF exacerbation was the source of  the patient's      shortness of breath.  He was monitored on telemetry.  His BNP was      211.  8. Nutritional deficiencies.  Ryan Brewer appears cachectic.  He denies      weight loss and when compared to his hospitalization this past      summer, he has actually gained 8 kg.  He had low potassium and      magnesium.  These were replaced and per nutrition, he was started      on a resource supplement.  He was continued on a daily vitamin and      daily B12 supplements.   DISCHARGE VITALS AND LABORATORY DATA:  Temperature 97.6, blood pressure  103/66, pulse was 88, respirations 20, his O2 saturation was 100% on 2  L.  White blood count was 18.2, hemoglobin 10.5, hematocrit 31.3,  platelets 379.  Sodium was 137, potassium 3.7, chloride 105, bicarb 25,  BUN 17, creatinine 0.83, glucose was 165.      Elby Showers, MD  Electronically Signed      Chauncey Reading, D.O.  Electronically Signed    CW/MEDQ  D:  05/18/2008  T:  05/18/2008  Job:  [id number]   cc:   Francene Castle, MD

## 2010-11-27 NOTE — Cardiovascular Report (Signed)
NAME:  Ryan Brewer, Ryan Brewer NO.:  [id number]   MEDICAL RECORD NO.:  [id number]          PATIENT TYPE:  INP   LOCATION:  2914                         FACILITY:  [facility]   PHYSICIAN:  Veverly Fells. Excell Seltzer, MD  DATE OF BIRTH:  [birth year]   DATE OF PROCEDURE:  03/08/2006  DATE OF DISCHARGE:                              CARDIAC CATHETERIZATION   INDICATIONS:  Ryan Brewer is a 65 year old African-American male who has a  history of adenocarcinoma of the lung.  He underwent left upper lobectomy  for Stage IA lung cancer in May of this year.  He has severe COPD but has  been recovering from his surgery at home, using cane to ambulate, and slowly  improving.  Reportedly early this morning, he developed chest discomfort and  shortness of breath that have persisted throughout the day and became  progressively worse.  Upon evaluation in the emergency department, he had  marked sinus tachycardia with heart rate near 150 and anterolateral ST-  segment elevation.  In the setting of his ongoing chest pain and ST  elevation, he was referred for emergent cardiac catheterization.   PROCEDURES:  1. Left heart catheterization.  2. Right heart catheterization.  3. Selective coronary angiography.  4. Left ventricular angiography.   PROCEDURAL DETAILS:  The risks and indications for the procedure were  explained to the patient who understood.  Verbal consent was obtained due to  the patient's marked shortness of breath and inability to sign written  consent.   The right groin was prepped and draped in the normal sterile fashion.  Using  1% lidocaine, the right groin was anesthetized, and arterial access was  gained with a 6-French arterial sheath via the modified Seldinger technique.  Catheters used included a 6-French JL4, JR4, and angled pigtail catheter.  All catheter exchanges were performed over a guidewire.  Following the  completion of his coronary and left ventricular angiogram, a  right heart  catheterization was performed with a Swan-Ganz catheter.  Again using the  modified Seldinger technique, a 7-French venous sheath was placed in the  right femoral vein without difficulty. Both sheaths were left in at the  conclusion of the case so that his hemodynamics could be monitored  invasively overnight.   FINDINGS:  Aortic pressure was 94/62 with a mean of 78.  Left ventricular  pressure was 103/0 with end-diastolic pressure of 11.  There was no gradient  across the aortic valve on pullback.  Right heart catheterization  demonstrated a mean right atrial pressure of 10.  Right ventricular pressure  was 32/4 with an end-diastolic pressure of 7.  Pulmonary arterial pressure  was 33/15 with a mean of 24.  Pulmonary capillary wedge pressure had an A  wave of 18, a V wave of 15, and mean of 14.  Aortic saturation was 89%, and  the pulmonary arterial oxygen saturation was 61%.   CORONARY ANATOMY:  The right coronary artery was widely patent.  There was  30% disease in the proximal segment and mild luminal irregularities  throughout the mid and distal segments.  The  right coronary artery  bifurcated distally into the PDA and posterior AV segments.  The posterior  AV segment gave off 2 small posterolateral branches.  There was excellent  myocardial blush throughout this distribution, and there were no right-to-  left collaterals visualized.   Left coronary artery: The left main stem is normal.  It bifurcated into the  circumflex and LAD, both of which are large-caliber vessels.   The left circumflex gives off a small first obtuse marginal branch then  courses down the atrioventricular groove and gives off 2 large  posterolateral branches.  There are mild luminal irregularities throughout  the proximal and mid left circumflex, but no significant stenoses are  present.   Left anterior descending: There is mild calcification of the proximal left  anterior descending.  Just  at the first septal perforator, there is an area  of 30% stenosis.  The remainder of the LAD  throughout its mid and distal  segments is free of disease.  Of note, the LAD does wrap around the apex.  The LAD gives off a large first diagonal branch that is free of disease.  There is normal flow throughout the LAD and no areas of haziness present.   Left ventriculogram was performed in the RAO 30-degree projection.  There is  hyperdynamic function of the basal segments of the anterior and inferior  walls.  The anteroapex as well as the inferoapex are akinetic.   IMPRESSION:  1. Clinical evidence of myocardial infarction with ST segment changes,      mild elevation of cardiac biomarkers, and segmental left ventricular      wall motion abnormality.  2. Mild calcification and luminal irregularities of the left anterior      descending without high-grade obstructive disease.  3. Mild luminal irregularities of the left circumflex.  4. Mild disease of the right coronary artery.  5. Normal left ventricular end-diastolic pressure.  6. Relatively normal right-sided hemodynamics.   DISCUSSION:  The etiology of Mr.  Brewer symptoms are still somewhat  unclear.  He is markedly short of breath and has an abnormal chest x-ray  consistent with either pulmonary edema or acute lung injury.  His cardiac  catheterization demonstrates a left ventricular wall motion abnormality in  the absence of significant obstructive coronary disease.  His wall motion  abnormality is in the LAD territory, and it is possible that he had a  thrombus in the LAD that has now resolved.  However, there is no residual  thrombus seen, and the flow throughout the vessel is completely normal.  Myocardial blush is normal as well.  The other possibility is that he had a  noncoronary event such as focal myocarditis.   Regardless of etiology, he will be monitored in the intensive care unit with his arterial sheath transduced  overnight as well as his Swan-Ganz catheter  in place for invasive hemodynamic monitoring.  I have called a  pulmonary/critical care consult, and he will be evaluated shortly as he may  require mechanical ventilation if his respiratory status deteriorates any  further.      Veverly Fells. Excell Seltzer, MD  Electronically Signed     MDC/MEDQ  D:  03/08/2006  T:  03/09/2006  Job:  [id number]

## 2010-11-27 NOTE — Consult Note (Signed)
NAME:  Ryan Brewer, Ryan Brewer NO.:  [id number]   MEDICAL RECORD NO.:  [id number]          PATIENT TYPE:  INP   LOCATION:  3308                         FACILITY:  [facility]   PHYSICIAN:  Jamison Neighbor, M.D.  DATE OF BIRTH:  Jun 20, 1946   DATE OF CONSULTATION:  12/03/2005  DATE OF DISCHARGE:                                   CONSULTATION   REASON FOR CONSULTATION:  Inability to place Foley catheter.   HISTORY OF PRESENT ILLNESS:  This 65 year old male was brought to the  hospital for a thoracoscopy and probable thoracotomy for removal of a  suspected lung cancer. The patient was scheduled to have a Foley catheter  inserted but this could not be placed by the CVTS surgeon or any of the  nurses. Urologic consultation was sought for placement of a Foley catheter.   HOSPITAL COURSE:  Once the procedure had ended and the chest tubes had been  placed and incision had been closed, the patient was repositioned into a  supine position. He was then re-prepped and draped. An 53 French Foley  catheter with a coude tip was inserted. Apparently a 16 French Foley  catheter had been inserted easier and it did not pass but the slightly  bigger 18 French catheter passed easily into the bladder. The urine that was  obtained was clear. A rectal examination was performed. The prostate was  relatively small and no real nodularity was identified. The patient's  history does not demonstrate any previous GU surgery or any urologic  problems. For that reason, it is recommended that the catheter be removed  when it is no longer required by CVTS and it is anticipated that as long as  the patient is ambulatory, he should be able to urinate without much  difficulty. If there are any problems with the catheter or with the  patient's voiding following catheter removal, please contact urology.   I appreciate the opportunity to share in his care.           ______________________________  Jamison Neighbor,  M.D.  Electronically Signed     RJE/MEDQ  D:  12/03/2005  T:  12/04/2005  Job:  [id number]   cc:   Salvatore Decent. Cornelius Moras, M.D.  [address]  [place]  [place] [address]

## 2010-11-27 NOTE — H&P (Signed)
NAME:  Ryan Brewer, Ryan Brewer NO.:  [id number]   MEDICAL RECORD NO.:  [id number]          PATIENT TYPE:  INP   LOCATION:  2914                         FACILITY:  [facility]   PHYSICIAN:  Bevelyn Buckles. Bensimhon, MDDATE OF BIRTH:  [birth year]   DATE OF ADMISSION:  03/08/2006  DATE OF DISCHARGE:                                HISTORY & PHYSICAL   PRIMARY CARE PHYSICIAN:  Unknown.   CARDIOLOGIST:  He is new to [facility] cardiology.   REASON FOR ADMISSION:  Acute anterolateral ST elevation myocardial  infarction.   HISTORY OF PRESENT ILLNESS:  Ryan Brewer is a 65 year old male with a history  of known COPD as well as left lingular adenocarcinoma of the lung which was  recently resected in May 2007.  He tells me he woke up this morning at 6:30  a.m. with chest pain.  This has been going on all day long.  He has been  having progressive dyspnea; and finally got to the point where he could not  breathe at all, and is brought to the emergency room.  On arrival to the  emergency room, he was in extreme respiratory story distress and markedly  tachycardia.  We were called to evaluate.  Initial EKG shows what appears to  be sinus tachycardia at a rate of 155 with marked anterolateral ST  elevation.  A code STEMI was activated.   REVIEW OF SYSTEMS:  Limited due to his medical condition.  He denies any  recent blood per rectum or melena.  There has been no other bleeding.   PAST MEDICAL HISTORY:  1. COPD.  2. History of a left lingular adenocarcinoma stage I status post left      upper lobe lobectomy on May 25th.  3. Hypertension   CURRENT MEDICATIONS:  None.   ALLERGIES:  None.   SOCIAL HISTORY:  This is limited to his condition.  He lives in [place].  He is retired.  He has history of tobacco use 2 packs a day, recently quit.  He also has a history of alcohol use, but quit some time ago.   FAMILY HISTORY:  Currently unobtainable.   PHYSICAL EXAM:  VITAL SIGNS:  He is in marked  respiratory distress.  He is  sitting upright breathing 35-40 times a minute.  He is on non-rebreather  saturating in the mid-to-high 90s.  He is afebrile.  Blood pressure is  140/80.  His heart rate is 160.  HEENT:  Sclerae anicteric.  EOMI.  There is no xanthelasmas.  Mucous  membranes are moist.  NECK:  Supple.  JVP is elevated to the ear.  Carotid are 2+ bilaterally.  No  obvious bruits.  There is no lymphadenopathy or thyromegaly.  CARDIAC:  He is tachycardia and regular.  No apparent murmur or rub.  LUNGS:  Diffuse rhonchi and crackles.  ABDOMEN:  Soft, cachectic, and nontender.  No masses or hepatosplenomegaly.  EXTREMITIES:  Warm with no cyanosis or clubbing.  Femoral pulses are 2+  bilaterally without any bruits.  NEURO:  He is alert and oriented x3.  Cranial nerves  are grossly intact.  Moves all 4 extremities without difficulty.   EKG:  Initial EKG shows sinus tachycardia at a rate of 160 with marked ST  elevation in V3 and V4.  Follow up EKG shows sinus tachycardia at a rate of  150 with resolution of his ST elevation.   POINT OF CARE LABS:  Show a sodium 133, potassium 2.5, creatinine 1.0.  Point of care of cardiac markers show a CK 6.4 with a myoglobin of greater  than 500, and a troponin 0.55.   ASSESSMENT:  Acute anterolateral ST elevation myocardial infarction  complicated by pulmonary edema; and significant tachycardia which appears to  be sinus.  We have treated him with aspirin, nitroglycerin, and IV Lasix.  We will take him emergently for a cardiac catheterization.      Bevelyn Buckles. Bensimhon, MD  Electronically Signed     DRB/MEDQ  D:  03/08/2006  T:  03/08/2006  Job:  [id number]

## 2010-11-27 NOTE — Discharge Summary (Signed)
NAME:  Ryan Brewer, Ryan Brewer NO.:  [id number]   MEDICAL RECORD NO.:  [id number]          PATIENT TYPE:  INP   LOCATION:  3703                         FACILITY:  [facility]   PHYSICIAN:  Salvadore Farber, MD  DATE OF BIRTH:  September 07, 1945   DATE OF ADMISSION:  03/08/2006  DATE OF DISCHARGE:                                 DISCHARGE SUMMARY   PRINCIPAL DIAGNOSIS:  Acute anterolateral ST-segment-elevation myocardial  infarction.   SECONDARY DIAGNOSES:  1. Ventilator-dependent respiratory failure.  2. Pneumonia.  3. Adult respiratory distress syndrome.  4. Hypotension and shock requiring vasopressor therapy.  5. Anemia.  6. Sepsis.  7. Severe chronic obstructive pulmonary disease.  8. 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  9. History of hypertension.  10.Remote tobacco abuse, smoking two packs a day for many years, quit this      year.   ALLERGIES:  No known drug allergies.   PROCEDURES:  1. Left heart cardiac catheterization.  2. A 2-D echocardiogram.  3. CT of the chest.   HISTORY OF PRESENT ILLNESS:  A 65 year old African-American male with prior  history of COPD and adenocarcinoma of the left lung status post left upper  lobectomy in May 2007 who was in his usual state of health until 6:30 a.m.  on March 08, 2006, when he awoke with chest pain and dyspnea that was  progressive throughout the day, prompting him to present to the [facility]  ED where he was noted to be sinus tachycardia with a rate of 155 beats per  minute and he had anterolateral ST-segment elevation on his EKG.  A Code  STEMI was called and he was taken emergently to the cardiac catheterization  laboratory for evaluation.   HOSPITAL COURSE:  Cardiac catheterization was performed on August 28,  revealing only minor luminal irregularities and otherwise nonobstructive  coronary artery disease with an EF of 40% with antero- and inferoapical  akinesis.   While in the catheterization laboratory he developed worsening  dyspnea requiring pulmonary/critical care consultation and intubation with  mechanical ventilation.  Postcatheterization, he was taken to the medical  ICU and was initiated on empiric antibiotics for management of what was felt  to be pneumonia based on bilateral airspace disease and infiltrates noted on  chest x-ray on August 28.  Unfortunately, he also displayed signs of septic  shock and required vasopressor therapy with norepinephrine infusion.  Because of his MI was felt to be secondary to vasospasm, however, we were  unable to initiate calcium channel blocker or nitrate therapy secondary to  his hypotension and shock.  The patient remained ventilator-dependent and  weaning trials were started September 6 and he appeared to tolerate pressure  support.  He was finally extubated on the evening of September 8.  Following  extubation he did have some issues with agitation and confusion, and  continued to have some degree of hypoxia which was felt secondary to severe  COPD requiring inhaler and steroid therapy.  He also continued to have  leukocytosis with white  counts as high as 24,000 or 25,000 despite negative  blood and sputum cultures.  Leukocytosis felt to be secondary to steroid  therapy and his antibiotics had been discontinued.  Ryan Brewer also remained  tachycardiac with heart rates in the low 100s to 110s post extubation.  Secondary to our belief that his MI was a result of vasospasm, we avoided  use of beta blockers.  On September 13 he developed low-grade fevers and he  was reinitiated on antibiotic therapy.  Notably, he also underwent a CT of  the chest which was suggestive of local recurrence of tumor with spread  throughout the left lung, particularly at the left base, as well as a new  interstitial prominence of the right base of uncertain etiology.  There is  also suspicion for bilateral hilar and mediastinal  adenopathy worse on the  left.  With regards to his lung CA, he will have followup as an outpatient  with hematology and oncology in [place] where he will live following  discharge with his son.  He has been working with rehab as well as speech  therapy and has been tolerating nectar-thick liquids.  He has been offered  skilled nursing as well as assisted living.  However, at this point the  patient and family prefer that he go home to live with his son in  [place].  The plan is for discharge on September 21, at which point we  will consolidate his IV antibiotics, which are currently treating a UTI with  urine culture positive for Klebsiella pneumoniae, to oral therapy.   Ryan Brewer also was noted to be anemic on September 18 with an H&H with a  decrease in hemoglobin by approximately 3 g.  His fecal occult blood testing  was positive although there is no overt bleeding in his stools.  Hemolysis  workup has been negative and his iron studies are slightly abnormal.  He  will be initiated on iron therapy prior to discharge and will require  outpatient GI evaluation in [place].   DISCHARGE LABORATORIES:  Hemoglobin 8.6, hematocrit 25.3, WBC 6.0, platelets  190, reticulocyte 0.3%, RBC 2.96.  Sodium 131, potassium 3.4, chloride 102,  CO2 26, BUN 9, creatinine 0.5, glucose 108, total bilirubin 0.6, calcium  7.9, LDH 155.  CK 40, MB 1.6, troponin I 0.07.  Total cholesterol 99,  triglycerides 58, HDL 27, LDL 60.  Serum iron 34, TIBC 161, UIBC 127,  haptoglobin 281.   DISPOSITION:  The patient will be discharged home with his son in  satisfactory condition.   FOLLOWUP PLANS AND APPOINTMENTS:  He is asked to establish primary care as  well as heme/onc and GI in the [place] area.   DISCHARGE MEDICATIONS:  1. Aspirin 81 mg daily.  2. Atrovent 0.5 mg inhaled q.i.d.  3. Xopenex 0.63 mg inhaled q.i.d.  4. Zocor 40 mg q.h.s. 5. Oral antibiotic for management of Klebsiella pneumoniae  to be added to      the patient's discharge sheet on day of discharge.   OUTSTANDING LABORATORY STUDIES:  None.   Duration discharge encounter 60 minutes including physician time.     ______________________________  Nicolasa Ducking, ANP      Salvadore Farber, MD  Electronically Signed    CB/MEDQ  D:  03/31/2006  T:  04/01/2006  Job:  [id number]

## 2010-11-27 NOTE — Discharge Summary (Signed)
NAME:  Ryan Brewer, BARG NO.:  [id number]   MEDICAL RECORD NO.:  [id number]          PATIENT TYPE:  INP   LOCATION:  3703                         FACILITY:  [facility]   PHYSICIAN:  Salvadore Farber, MD  DATE OF BIRTH:  07/01/1946   DATE OF ADMISSION:  03/08/2006  DATE OF DISCHARGE:  04/01/2006                                 DISCHARGE SUMMARY   ADDENDUM:  Please see previous discharge dictation, job number [id number] for  complete details.   SECONDARY DIAGNOSES:  In addition, Klebsiella pneumonia urinary tract  infection.   [name] will be discharged home today, September 21st.  His family and  social worker is currently trying to make arrangements for rehabilitation in  [place].  However, if this is not able to be accomplished today, he will  be discharged to his son's home with home health, PT, RN, and IV therapy.  Secondary to Klebsiella pneumonia UTI, he will require antibiotic therapy  for an additional 3 days.  Antibiotics prescribed will be cefepime 1 gm IV  q.12h. as well as vancomycin 750 mg IV q.6h.  The patient has a PICC line  and will be discharged with his PICC line.  PICC line can be discontinued  following completing of IV antibiotic therapy.     ______________________________  Nicolasa Ducking, ANP      Salvadore Farber, MD  Electronically Signed    CB/MEDQ  D:  04/01/2006  T:  04/01/2006  Job:  [id number]

## 2010-11-27 NOTE — Op Note (Signed)
NAME:  Ryan Brewer, Ryan Brewer                 ACCOUNT NO.:  [id number]   MEDICAL RECORD NO.:  [id number]          PATIENT TYPE:  AMB   LOCATION:  SDS                          FACILITY:  [facility]   PHYSICIAN:  Evelene Croon, M.D.     DATE OF BIRTH:  17-May-1946   DATE OF PROCEDURE:  11/22/2005  DATE OF DISCHARGE:                                 OPERATIVE REPORT   PREOPERATIVE DIAGNOSIS:  Lingular lung mass.   POSTOPERATIVE DIAGNOSIS:  Lingular lung mass.   PROCEDURE:  Flexible fiberoptic bronchoscopy, mediastinoscopy with lymph  node biopsies.   SURGEON:  Evelene Croon, M.D.   ASSISTANT:  None.   ANESTHESIA:  General endotracheal.   INDICATIONS FOR PROCEDURE:  This patient is a 65 year old gentleman with a  long history of smoking who had a chest x-ray performed as part of a routine  physical exam showing a lingular lung mass.  CT scan of the chest on October 19, 2005 showed a 2.8 x 1.7 x 2.5-cm spiculated mass in the lingular segment  of the left lung.  There was no other adenopathy noted.  The patient  subsequently had a PET scan which showed increased uptake within the  lingular lung mass.  It also showed increased uptake within bilateral hilar  lymph nodes as well as some anterior mediastinal lymph nodes.  There was  increased uptake in the AP window lymph nodes.  Given these findings, I felt  it would be best to proceed with bronchoscopy and mediastinoscopy to rule  out mediastinal lymph node metastasis before considering proceeding with  thoracotomy and lobectomy.  I discussed the operative procedure of flexible  bronchoscopy with mediastinoscopy with the patient, including alternatives,  benefits, and risks, including bleeding, infection, pneumothorax, and  inability to obtain a tissue diagnosis.  He understood and agreed to  proceed.   DESCRIPTION OF PROCEDURE:  The patient was taken to the operating room and  placed on the table in the supine position.  After induction of general  endotracheal anesthesia using a single-lumen tube, flexible fiberoptic  bronchoscopy was performed.   The distal trachea appeared normal.  The carina was sharp.  The right  bronchial tree had normal segmental anatomy.  There were no endobronchial  lesions seen.  The left bronchial tree also had normal segmental anatomy,  and there were no endobronchial lesions and no secretions.  I did not  perform bronchoscopic biopsy or brushings because I thought the yield for  obtaining a tissue diagnosis would be low and my decision about performing  left lung resection would be based solely on the results of mediastinoscopy.  The bronchoscope was withdrawn from the patient.   Then, the patient was positioned for mediastinoscopy with a roll beneath the  shoulders and the neck extended.  The neck was prepped with Betadine soap  and solution and draped in the usual sterile manner.  A 2-cm incision was  made 1 fingerbreadth above the sternal notch in the midline.  This was made  along the natural skin line.  Dissection was continued down to the trachea  using  electrocautery.  A pretracheal plane was developed down in the  mediastinum.  In the upper anterior paratracheal region just below the  sternal notch, there were several small lymph nodes, and these were sent for  permanent section.  The mediastinoscope was then inserted.  Mediastinoscopy  showed multiple lymph nodes present within the anterior mediastinum.  Biopsies were taken in the right upper paratracheal region x2.  Biopsy was  taken in the right lower paratracheal as well as the right tracheobronchial  angle.  There were multiple subcarinal lymph nodes, and these were sent to  pathology x2.  Biopsies were also taken at the left tracheobronchial angle,  left lower paratracheal, and left upper paratracheal region.  These were all  sent for permanent section.  There was complete hemostasis.  The  mediastinoscope was withdrawn from the patient.   Then the muscles were  reapproximated with interrupted 3-0 Vicryl sutures.  The skin was closed  with a continuous 3-0 Vicryl subcuticular closure.  The sponge, needle, and  instrument counts were correct according to the scrub nurse.  A dry sterile  waterproof dressing was placed over the incision.   The patient was then awakened, extubated, and transported to the  postanesthesia care unit in satisfactory and stable condition.      Evelene Croon, M.D.  Electronically Signed     BB/MEDQ  D:  11/22/2005  T:  11/23/2005  Job:  [id number]

## 2010-11-27 NOTE — Op Note (Signed)
NAME:  Ryan Brewer, Ryan Brewer                 ACCOUNT NO.:  [id number]   MEDICAL RECORD NO.:  [id number]          PATIENT TYPE:  INP   LOCATION:  2550                         FACILITY:  [facility]   PHYSICIAN:  Salvatore Decent. Cornelius Moras, M.D. DATE OF BIRTH:  [birth year]   DATE OF PROCEDURE:  12/03/2005  DATE OF DISCHARGE:                                 OPERATIVE REPORT   PREOPERATIVE DIAGNOSIS:  Left lung mass.   POSTOPERATIVE DIAGNOSIS:  Left lung cancer.   PROCEDURE:  Left video assisted thoracoscopic surgery and miniature left  muscle and rib sparing thoracotomy for left upper lobectomy with mediastinal  lymph node dissection.   SURGEON:  Salvatore Decent. Cornelius Moras, M.D.   ASSISTANT:  Stephanie Acre Dominick, P.A.-C.   ANESTHESIA:  General.   BRIEF CLINICAL NOTE:  The patient is 65 year old African American male with  a long standing history of tobacco abuse who was recently found to have a  suspicious mass in the left upper lobe on routine chest x-ray and follow-up  chest CT scan.  PET scan demonstrates hypermetabolic activity of the mass  suspicious for primary lung cancer.  The patient has previously undergone  bronchoscopy and cervical mediastinoscopy by Dr. Evelene Croon.  All  mediastinal lymph nodes were negative and the patient's airway exam at the  time of bronchoscopy was normal.  The patient now presents for surgical  intervention for definitive diagnosis and treatment.   OPERATIVE CONSENT:  The patient has been counseled at length regarding the  indications and potential benefits of surgical exploration for initial wedge  resection of the mass in question for definitive diagnosis.  If the mass,  indeed, turns out to be cancer, the patient understands the rationale for  proceeding with lobectomy and mediastinal lymph node dissection for  definitive surgical treatment and staging.  Alternative treatment strategies  have been discussed.  The patient understands and accepts all associated  risks of  surgery including but not limited to risks of death, myocardial  infarction, respiratory failure, pneumonia, pulmonary embolus, bleeding  requiring blood transfusion, arrhythmia, prolonged chest wall discomfort,  prolonged air leak requiring chest tube drainage, recurrence of malignant  process if the mass, in fact, turns out to be cancerous.  All of his  questions have been addressed.   OPERATIVE NOTE IN DETAIL:  The patient is brought to the operating room on  the above mentioned date and placed in the supine position on the operating  table.  A radial arterial line and central venous catheter are placed by the  anesthesia service under the care and direction of Dr. Kipp Brood.  General endotracheal anesthesia is induced uneventfully and a dual lumen  endotracheal tube was placed and its position verified by Dr. Noreene Larsson.  Intravenous antibiotics were administered.   Multiple attempts at placement of a Foley catheter were performed initially  by nursing staff in the operating suite and subsequently by myself.  There  was severe resistance at the level of the prostate gland consistent with  bladder outlet obstruction.  Interoperative urology consultation was  requested.  Due to considerable delay with  a response from the urology team,  the decision is made to proceed with the operation as previously planned  with intention of urology consultation for Foley catheter placement after  completion of the operation.  Pneumatic sequential compression boots were  placed on both lower extremities.   The patient is turned to the right lateral decubitus position using an  axillary roll and pneumatic beanbag device to facilitate positioning.  The  patient's left chest was prepped and draped in a sterile manner.  Single  lung ventilation is begun.  A small incision is made overlying the sixth  intercostal space anteriorly.  The subcutaneous tissues are divided with  electrocautery.  The left  pleural space was entered bluntly.  A 10 mm port  was passed through the incision and a videoscopic camera is passed through  the port.  The left chest was explored visually.  The lung is collapsed with  the exception of some adhesions at the lung apex between the visceral and  parietal pleura.  One can easily appreciate some dimpling along the surface  of the left upper lobe anterior laterally in the vicinity of the known  peripheral lung lesion documented on previous CT scan and PET scan.  Two  additional port incisions were placed including one located anteriorly  through the fourth intercostal space and another located in the posterior  axillary line through the fifth intercostal space.  Through these port  incisions, the lung was grasped and the suspicious mass in question  verified.  Wedge resection of the mass was performed using several fires of  the Echelon endoscopic stapling device.  The wedge resection of the left  upper lobe consists of degeneration portion of the lingular segments and the  lateral portion of the left upper lobe where the mass is in question.  The  wedge specimen is sent to pathology for frozen section histology.   Preliminary frozen section pathology results documents the presence of a  mass which appears to be a non small cell lung cancer and the stapled  margins of the wedge specimen are notably free of tumor.   The posterior port incision through the fifth intercostal space is converted  to a miniature muscle and rib sparing thoracotomy incision.  Subcutaneous  flaps are raised over the latissimus dorsi muscle and the muscle fibers of  the latissimus dorsi muscle fibers were split longitudinally.  The serratus  anterior muscle is retracted anteriorly.  The left pleural space was entered  through the fifth intercostal space.  Using the two previous port incisions and the miniature thoracotomy incision, the remainder of the operation was  completed.  The  reflection of the visceral pleura around the hilum of the  lung is divided with electrocautery and the inferior pulmonary ligament is  divided.  Notably, numerous lymph nodes in the AP window as well as along  the pulmonary artery, although none of these lymph nodes appear to be  pathologically involved with cancer.  The soft tissue overlying the  pulmonary artery is somewhat more dense than usual and hyperemic.  This  tissue is divided overlying the pulmonary artery and the individual branches  of the left pulmonary artery to the left upper lobe are identified and  individually encircled with vessel loops.  One lymph node located along the  pulmonary artery and peribronchial region is removed and sent to pathology  labeled level 10L lymph node.  Once each of the individual branches of the  pulmonary artery to  the left upper lobe are identified, they are  subsequently divided using the ATW-35 stapling device with vascular load 2.5  mm staples.  The major fissure is now completed by dissecting overlying the  top of the pulmonary artery as it courses down to the left lower lobe and  extending across the floor of the major fissure.  This was completed with  the Echelon stapling device.  The tissue between the superior pulmonary vein  and the left upper lobe bronchus is notably quite dense.  After the  remainder of the soft tissues have been divided, this space is bluntly  developed and ultimately the superior pulmonary vein is encircled.  The  superior pulmonary vein is then divided with the ATW vascular load stapling  device.  The remaining soft tissues in the bronchus to the left upper lobe  were divided using the Echelon stapling device with Goldfire staples.  Prior  to completing the resection, the left lower lobe was briefly inflated to  make sure that the left lung still ventilated appropriately.  The remainder  of the left upper lobe is removed and sent to pathology for permanent   histology.  One large lymph node located along the floor of the major  fissure was also dissected out and sent to pathology labeled 11L lymph  nodes.  A large area of lymph node tissue in the AP window was dissected  from the associated structures and sent to pathology labeled level 5 lymph.  The left chest is filled with warm saline solution.  The left lower lobe is  ventilated and inspected carefully.  There is no sign of any air leaks.  Meticulous surgical hemostasis is ascertained.   The On-Q continuous pain management system is utilized for postoperative  pain control.  A small stab incision is made posteriorly several interspaces  below the anterior thoracotomy incision.  The On-Q catheter is tunneled from  anteriorly to posteriorly through the subcutaneous tissues to reach this  incision.  Through the small incision, the blunt tunneling device was then passed through the intercostal space and tunneled into the subpleural space  posteriorly along the nerve roots just lateral to the spinal column.  The On-  Q catheter is positioned into this pocket and the introducing sheath is then  removed.   The left chest is drained using two 28-French chest tubes exited through  separate stab incisions inferiorly.  The miniature thoracotomy incision is  closed in multiple layers in routine fashion and each of the three other  small remaining surgical incisions are also closed in multiple layers.  All  skin incisions are closed with running subcuticular skin closure.  The chest  tubes are affixed to a closed suction drainage device.  The On-Q catheter  was flushed with 5 mL of 0.5% bupivacaine and ultimately connected to a  continuous infusion pump.   The patient tolerated the procedure well.  The patient is turned back to the  supine position.  Dr. Logan Bores from the urology team was consulted and a Foley  catheter is ultimately placed by Dr. Logan Bores.  The patient is extubated in the  operating room  and transported to the recovery room in stable condition.  There are no interoperative complications other than the need for urology  consultation for Foley catheter placement.  Estimated blood loss for the  procedure was 400 mL.  All sponge, instrument and needle counts verified  correct.      Salvatore Decent. Cornelius Moras, M.D.  Electronically  Signed     CHO/MEDQ  D:  12/03/2005  T:  12/03/2005  Job:  [id number]   cc:   Silas Sacramento, M.D.  Fax: [phone]

## 2010-11-27 NOTE — Consult Note (Signed)
NAME:  Ryan, Brewer NO.:  [id number]   MEDICAL RECORD NO.:  [id number]          PATIENT TYPE:  INP   LOCATION:  2914                         FACILITY:  [facility]   PHYSICIAN:  Lonia Blood, M.D.DATE OF BIRTH:  June 08, 1946   DATE OF CONSULTATION:  03/23/2006  DATE OF DISCHARGE:                                   CONSULTATION   REFERRING PHYSICIAN:  Bevelyn Buckles. Bensimhon, M.D., [facility].   PRIMARY CARE PHYSICIAN:  Unassigned.   ATTENDING PHYSICIAN:  [facility].   REASON FOR CONSULTATION:  Multiple medical problems.   HISTORY OF PRESENT ILLNESS:  [name] is a very pleasant 65 year old  gentleman with an unfortunate recent history of left lingular adenocarcinoma  of the lung who underwent a resection in May 2007.  He was admitted to the  hospital to the care of [facility] on March 08, 2006, when he  presented with complaints of severe shortness of breath and chest pain.  Ultimate evaluation revealed an acute anterolateral ST-elevation myocardial  infarction.  The patient was taken to the catheterization lab.  Surprisingly, the cardiac catheterization failed to reveal any significant  obstructive coronary artery disease but did reveal a defined area of  anterolateral wall akinesis.  Unfortunately post catheterization, the  patient became hemodynamically unstable.  He suffered an acute respiratory  arrest and required intubation.  He was then transferred to the medical  intensive care unit and was seen in consultation by [facility].  After a prolonged hospital course in the ICU for treatment  of what appeared to be pneumonia with ARDS and SIRS, the patient was able to  be extubated.  He continues to improve.  Critical care has signed off, and  [facility] team has asked that the [facility] pick up  the case from a consultative and continue to assist with ongoing medical  care.   Upon  entering the room, I found the patient on a Venturi face mask at 40%.  He is comfortable.  He denies any complaints whatsoever but does state that  he is hungry.  His breathing appears comfortable. He is alert and  surprisingly oriented at the present time.   REVIEW OF SYSTEMS:  Other than feeling hungry, the patient vehemently denies  any other complaints at the present time.  Comprehensive Review of Systems  is, therefore, unremarkable.   PAST MEDICAL HISTORY:  1. Severe COPD.  2. Left lingular lung adenocarcinoma status post resection May 2007 with      no further followup to date.  3. Hypertension.  4. Tobacco abuse in the amount of 2 packs per day but discontinuation this      year.   OUTPATIENT MEDICATIONS:  The patient was not on medications prior to this  admission.   INPATIENT MEDICATIONS:  The patient's complete MAR is reviewed.   ALLERGIES:  No known drug allergies.   FAMILY HISTORY:  Noncontributory to this admission.   SOCIAL HISTORY:  The patient lives in [place].  He is retired.  He does  not drink.  He  does not use illicit drugs.   DATA REVIEW:  Sodium, potassium, bicarb, BUN, and creatinine were all  normal. Serum glucose elevated at 219. Calcium 8.8.  Hemoglobin is low at  11.  White count is elevated at 22,000.  MCV is normal.  Platelet count is  normal.  Albumin is 2.3 which is markedly low.  LFTs are normal.  Guaiac is  negative.  C. difficile toxin of stools negative x3.   Chest x-ray reveals unchanged bilateral air space disease.   PHYSICAL EXAMINATION:  VITAL SIGNS: Temperature 99.4, blood pressure 98 to  115/55 to 75, heart rate 115 to 130, respiratory rate 15 to 35, O2  saturation 99% on 0.40 face mask.  I's and O's were negative for the last 24  hours.  GENERAL: Markedly cachectic male in no acute respiratory distress at the  present time.  HEENT: Normocephalic and atraumatic.  Pupils equal, round, and reactive to  light and accommodation.   Extraocular muscles intact bilaterally.  Oropharynx and oral canal clear.  NECK: No JVD at approximately 45 degrees.  LUNGS:  Decreased breath sounds throughout all fields, bibasilar crackles,  no wheezes.  CARDIOVASCULAR: Tachycardic at approximately 110 beats per minute but  regular without gallop or rub.  ABDOMEN: Markedly thin, soft. Bowel sounds present.  No hepatosplenomegaly,  no rebound or ascites.  EXTREMITIES: Wasted, no cyanosis, clubbing, or edema bilateral lower  extremities.  CUTANEOUS: No evidence of lower extremity ulcerations or skin breakdown.  NEUROLOGIC: The patient is alert and oriented x4.  Cranial nerves II-XII  intact bilaterally.  There is 4+/5 strength in bilateral upper and lower  extremities.  DTRs are minimal but symmetric.  There is no Babinski.   IMPRESSION AND PLAN:  1. Status post acute anterolateral ST-elevation myocardial infarction:      Ongoing care will be  provided by the primary team which is [facility]      [facility].  2. Severe chronic obstructive pulmonary disease: The patient appears to be      currently well managed.  We will continue his nebulizers as you are      presenting dosing him.  We will begin steroid wean.  3. Lingular adenocarcinoma: At present, the patient appears to be stable      from this standpoint.  He will need hematology/oncology followup once      he has improved to the point of discharge.  4. Hypertension: The patient's blood pressure is actually currently      decreased.  This prohibits [place] from initiating any medical therapy which      might be used for multiple comorbidities.  We will follow the patient's      blood pressure without change today.  5. Acute respiratory failure: The patient has been extubated and appears      to be stable.  His chest x-ray will likely remain abnormal for weeks if      not months.  We will follow his exam closely.  CT scan is being     obtained by the primary service today, and we will  follow this up.  6. Hyperglycemia: This has been very unpredictable with episodes of      hypoglycemia as well.  We will follow CBGs on a b.i.d. basis.  I will      discontinue dextrose through his IV as the patient will begin p.o.      intake today.  7. Normocytic anemia.  There are multiple potential etiologies for this.  The patient is guaiac negative.  I will check a B12 and a folic acid      level to complete the workup that has already been accomplished.   Thank you very much for your consultation on this kind gentleman.  We will  be happy to continue to follow along with you.      Lonia Blood, M.D.  Electronically Signed     JTM/MEDQ  D:  03/23/2006  T:  03/23/2006  Job:  [id number]

## 2011-04-06 LAB — DIFFERENTIAL
Basophils Absolute: 0.1
Basophils Relative: 1
Monocytes Absolute: 1
Neutro Abs: 3.8
Neutrophils Relative %: 57

## 2011-04-06 LAB — CBC
HCT: 33.2 — ABNORMAL LOW
Hemoglobin: 11.6 — ABNORMAL LOW
MCHC: 35
MCV: 102.6 — ABNORMAL HIGH
RDW: 14.8

## 2011-04-09 LAB — DIFFERENTIAL
Basophils Absolute: 0.1
Eosinophils Absolute: 0
Eosinophils Relative: 0
Lymphocytes Relative: 20
Lymphocytes Relative: 20
Lymphocytes Relative: 3 — ABNORMAL LOW
Lymphocytes Relative: 5 — ABNORMAL LOW
Lymphs Abs: 0.5 — ABNORMAL LOW
Lymphs Abs: 0.9
Lymphs Abs: 1.2
Monocytes Absolute: 1.8 — ABNORMAL HIGH
Monocytes Relative: 16 — ABNORMAL HIGH
Neutro Abs: 13 — ABNORMAL HIGH
Neutro Abs: 3.7
Neutro Abs: 3.7
Neutrophils Relative %: 62
Neutrophils Relative %: 89 — ABNORMAL HIGH
Smear Review: ADEQUATE

## 2011-04-09 LAB — COMPREHENSIVE METABOLIC PANEL
ALT: 74 — ABNORMAL HIGH
ALT: 78 — ABNORMAL HIGH
ALT: 87 — ABNORMAL HIGH
AST: 107 — ABNORMAL HIGH
AST: 93 — ABNORMAL HIGH
Albumin: 1.6 — ABNORMAL LOW
Albumin: 1.8 — ABNORMAL LOW
Albumin: 1.9 — ABNORMAL LOW
Albumin: 2 — ABNORMAL LOW
Alkaline Phosphatase: 123 — ABNORMAL HIGH
BUN: 2 — ABNORMAL LOW
BUN: 5 — ABNORMAL LOW
CO2: 20
Calcium: 6.6 — ABNORMAL LOW
Calcium: 7.2 — ABNORMAL LOW
Calcium: 7.7 — ABNORMAL LOW
Chloride: 105
Chloride: 107
Chloride: 114 — ABNORMAL HIGH
Creatinine, Ser: 0.8
Creatinine, Ser: 0.91
GFR calc Af Amer: >60
GFR calc Af Amer: >60
Glucose, Bld: 112 — ABNORMAL HIGH
Glucose, Bld: 88
Potassium: 3.3 — ABNORMAL LOW
Sodium: 137
Sodium: 138
Sodium: 138
Total Bilirubin: 0.8
Total Bilirubin: 1.5 — ABNORMAL HIGH
Total Bilirubin: 1.6 — ABNORMAL HIGH
Total Protein: 5.6 — ABNORMAL LOW

## 2011-04-09 LAB — BASIC METABOLIC PANEL
BUN: 2 — ABNORMAL LOW
BUN: 4 — ABNORMAL LOW
BUN: 9
BUN: 9
CO2: 19
CO2: 25
CO2: 25
CO2: 28
Calcium: 7.5 — ABNORMAL LOW
Calcium: 7.9 — ABNORMAL LOW
Calcium: 7.9 — ABNORMAL LOW
Calcium: 8.2 — ABNORMAL LOW
Chloride: 103
Chloride: 96
Chloride: 98
Chloride: 99
Creatinine, Ser: 0.78
Creatinine, Ser: 0.89
Creatinine, Ser: 0.9
GFR calc Af Amer: >60
GFR calc Af Amer: >60
GFR calc Af Amer: >60
GFR calc Af Amer: >60
GFR calc Af Amer: >60
GFR calc non Af Amer: >60
GFR calc non Af Amer: >60
GFR calc non Af Amer: >60
GFR calc non Af Amer: >60
GFR calc non Af Amer: >60
Glucose, Bld: 129 — ABNORMAL HIGH
Glucose, Bld: 172 — ABNORMAL HIGH
Glucose, Bld: 79
Glucose, Bld: 87
Potassium: 2 — CL
Potassium: 2.4 — CL
Potassium: 3.5
Potassium: 3.5
Potassium: 3.8
Potassium: 4.3
Potassium: 4.9
Sodium: 132 — ABNORMAL LOW
Sodium: 134 — ABNORMAL LOW
Sodium: 134 — ABNORMAL LOW
Sodium: 137

## 2011-04-09 LAB — CARDIAC PANEL(CRET KIN+CKTOT+MB+TROPI)
CK, MB: 1.3
CK, MB: 2.4
CK, MB: 4.6 — ABNORMAL HIGH
Relative Index: INVALID
Relative Index: INVALID
Relative Index: INVALID
Total CK: 75
Total CK: 89
Troponin I: 0.06
Troponin I: 0.12 — ABNORMAL HIGH
Troponin I: 0.27 — ABNORMAL HIGH

## 2011-04-09 LAB — URINE CULTURE
Colony Count: 10000
Colony Count: NO GROWTH

## 2011-04-09 LAB — CARBOXYHEMOGLOBIN
Carboxyhemoglobin: 1.1
Carboxyhemoglobin: 1.3
Methemoglobin: 0.4
O2 Saturation: 72.8
Total hemoglobin: 10.1 — ABNORMAL LOW

## 2011-04-09 LAB — POCT I-STAT 3, ART BLOOD GAS (G3+)
Acid-Base Excess: 12 — ABNORMAL HIGH
Bicarbonate: 25.5 — ABNORMAL HIGH
O2 Saturation: 98
Operator id: 252031
Operator id: 281271
Patient temperature: 98.6
Patient temperature: 98.7
TCO2: 27
TCO2: 35
pCO2 arterial: 38.4
pH, Arterial: 7.365
pH, Arterial: 7.452 — ABNORMAL HIGH
pH, Arterial: 7.59 — ABNORMAL HIGH

## 2011-04-09 LAB — CBC
HCT: 26.9 — ABNORMAL LOW
HCT: 29.3 — ABNORMAL LOW
HCT: 29.5 — ABNORMAL LOW
HCT: 30 — ABNORMAL LOW
HCT: 31 — ABNORMAL LOW
HCT: 31.1 — ABNORMAL LOW
HCT: 33.7 — ABNORMAL LOW
HCT: 37.8 — ABNORMAL LOW
Hemoglobin: 10 — ABNORMAL LOW
Hemoglobin: 10.7 — ABNORMAL LOW
Hemoglobin: 10.8 — ABNORMAL LOW
Hemoglobin: 11.8 — ABNORMAL LOW
Hemoglobin: 9.8 — ABNORMAL LOW
MCHC: 32.9
MCHC: 33.7
MCHC: 34.1
MCHC: 34.6
MCV: 109.5 — ABNORMAL HIGH
MCV: 110.1 — ABNORMAL HIGH
MCV: 110.6 — ABNORMAL HIGH
MCV: 111 — ABNORMAL HIGH
MCV: 111.8 — ABNORMAL HIGH
MCV: 112.1 — ABNORMAL HIGH
Platelets: 168
Platelets: 172
Platelets: 176
Platelets: 226
Platelets: 341
Platelets: 358
Platelets: 427 — ABNORMAL HIGH
RBC: 2.5 — ABNORMAL LOW
RBC: 2.64 — ABNORMAL LOW
RBC: 2.66 — ABNORMAL LOW
RBC: 2.83 — ABNORMAL LOW
RBC: 3.06 — ABNORMAL LOW
RDW: 15.1
RDW: 15.4
RDW: 15.6 — ABNORMAL HIGH
RDW: 15.9 — ABNORMAL HIGH
WBC: 11.7 — ABNORMAL HIGH
WBC: 14.2 — ABNORMAL HIGH
WBC: 14.7 — ABNORMAL HIGH
WBC: 17.4 — ABNORMAL HIGH
WBC: 6.1
WBC: 6.5
WBC: 7.6

## 2011-04-09 LAB — RETICULOCYTES
RBC.: 2.95 — ABNORMAL LOW
Retic Ct Pct: 1.7

## 2011-04-09 LAB — PHOSPHORUS
Phosphorus: 1.6 — ABNORMAL LOW
Phosphorus: 3.1
Phosphorus: 3.1
Phosphorus: 3.3
Phosphorus: <1 — CL

## 2011-04-09 LAB — HEPATIC FUNCTION PANEL
Albumin: 2.2 — ABNORMAL LOW
Alkaline Phosphatase: 123 — ABNORMAL HIGH
Indirect Bilirubin: 0.9
Total Protein: 6.4

## 2011-04-09 LAB — EXPECTORATED SPUTUM ASSESSMENT W GRAM STAIN, RFLX TO RESP C

## 2011-04-09 LAB — VITAMIN B12: Vitamin B-12: >2000 — ABNORMAL HIGH (ref 211–911)

## 2011-04-09 LAB — POCT I-STAT 3, VENOUS BLOOD GAS (G3P V)
Acid-base deficit: 1
Acid-base deficit: 3 — ABNORMAL HIGH
Bicarbonate: 22
O2 Saturation: 69
O2 Saturation: 70
Operator id: 118461
Operator id: 118461
Operator id: 118461
pCO2, Ven: 37.9 — ABNORMAL LOW
pH, Ven: 7.411 — ABNORMAL HIGH
pO2, Ven: 37

## 2011-04-09 LAB — TSH: TSH: 2.233

## 2011-04-09 LAB — URINALYSIS, ROUTINE W REFLEX MICROSCOPIC
Nitrite: POSITIVE — AB
Specific Gravity, Urine: >1.046 — ABNORMAL HIGH
Urobilinogen, UA: >8 — ABNORMAL HIGH

## 2011-04-09 LAB — STREP PNEUMONIAE URINARY ANTIGEN: Strep Pneumo Urinary Antigen: NEGATIVE

## 2011-04-09 LAB — CK TOTAL AND CKMB (NOT AT ARMC)
CK, MB: 3.6
Relative Index: 2.9 — ABNORMAL HIGH
Total CK: 123

## 2011-04-09 LAB — LIPID PANEL
Cholesterol: 76
HDL: 26 — ABNORMAL LOW
Total CHOL/HDL Ratio: 2.9
VLDL: 12

## 2011-04-09 LAB — POCT I-STAT, CHEM 8
Calcium, Ion: 0.96 — ABNORMAL LOW
Glucose, Bld: 101 — ABNORMAL HIGH
HCT: 30 — ABNORMAL LOW
Hemoglobin: 10.2 — ABNORMAL LOW
Potassium: 3.3 — ABNORMAL LOW

## 2011-04-09 LAB — POCT CARDIAC MARKERS
CKMB, poc: 3.7
Myoglobin, poc: >500

## 2011-04-09 LAB — CULTURE, BLOOD (ROUTINE X 2): Culture: NO GROWTH

## 2011-04-09 LAB — MAGNESIUM
Magnesium: 1.5
Magnesium: 2.7 — ABNORMAL HIGH

## 2011-04-09 LAB — URINE MICROSCOPIC-ADD ON

## 2011-04-09 LAB — TROPONIN I
Troponin I: 0.11 — ABNORMAL HIGH
Troponin I: 0.18 — ABNORMAL HIGH

## 2011-04-09 LAB — APTT: aPTT: 33

## 2011-04-09 LAB — LEGIONELLA ANTIGEN, URINE

## 2011-04-09 LAB — TYPE AND SCREEN
ABO/RH(D): O POS
Antibody Screen: NEGATIVE

## 2011-04-09 LAB — LACTIC ACID, PLASMA: Lactic Acid, Venous: 1.6

## 2011-04-09 LAB — B-NATRIURETIC PEPTIDE (CONVERTED LAB): Pro B Natriuretic peptide (BNP): 171 — ABNORMAL HIGH

## 2011-04-09 LAB — H1N1 SCREEN (PCR): H1N1 Virus Scrn: NOT DETECTED

## 2011-04-09 LAB — FOLATE: Folate: 10.9

## 2011-04-09 LAB — D-DIMER, QUANTITATIVE: D-Dimer, Quant: 0.77 — ABNORMAL HIGH

## 2011-04-09 LAB — IRON AND TIBC: TIBC: 104 — ABNORMAL LOW

## 2011-04-13 LAB — MAGNESIUM
Magnesium: 1.8
Magnesium: 1.9

## 2011-04-13 LAB — CBC
HCT: 28.6 — ABNORMAL LOW
HCT: 31.3 — ABNORMAL LOW
Hemoglobin: 10.5 — ABNORMAL LOW
Hemoglobin: 9.8 — ABNORMAL LOW
MCHC: 33.5
MCHC: 34.1
MCHC: 34.9
MCV: 95
MCV: 95.5
MCV: 95.6
MCV: 96.8
Platelets: 298
Platelets: 299
Platelets: 299
Platelets: 379
RBC: 3.01 — ABNORMAL LOW
RBC: 3.14 — ABNORMAL LOW
RBC: 3.32 — ABNORMAL LOW
RDW: 13.4
RDW: 13.9
RDW: 14
WBC: 11.9 — ABNORMAL HIGH
WBC: 13.3 — ABNORMAL HIGH

## 2011-04-13 LAB — URINALYSIS, MICROSCOPIC ONLY
Nitrite: NEGATIVE
Protein, ur: NEGATIVE
Specific Gravity, Urine: 1.024
Urobilinogen, UA: 2 — ABNORMAL HIGH

## 2011-04-13 LAB — DIFFERENTIAL
Basophils Absolute: 0
Basophils Relative: 0
Basophils Relative: 0
Eosinophils Absolute: 0
Eosinophils Relative: 0
Lymphocytes Relative: 10 — ABNORMAL LOW
Lymphocytes Relative: 12
Lymphs Abs: 1.6
Monocytes Absolute: 1.2 — ABNORMAL HIGH
Monocytes Relative: 10
Monocytes Relative: 9
Neutro Abs: 10.5 — ABNORMAL HIGH
Neutrophils Relative %: 79 — ABNORMAL HIGH

## 2011-04-13 LAB — BASIC METABOLIC PANEL
BUN: 14
BUN: 17
CO2: 22
CO2: 22
CO2: 24
CO2: 25
Calcium: 8.7
Calcium: 8.7
Calcium: 8.9
Calcium: 9.1
Chloride: 103
Chloride: 105
Chloride: 105
Creatinine, Ser: 0.74
Creatinine, Ser: 0.78
Creatinine, Ser: 0.79
GFR calc Af Amer: >60
GFR calc Af Amer: >60
GFR calc Af Amer: >60
GFR calc non Af Amer: >60
GFR calc non Af Amer: >60
GFR calc non Af Amer: >60
Glucose, Bld: 165 — ABNORMAL HIGH
Glucose, Bld: 236 — ABNORMAL HIGH
Potassium: 3.7
Sodium: 135
Sodium: 136
Sodium: 137

## 2011-04-13 LAB — CARDIAC PANEL(CRET KIN+CKTOT+MB+TROPI)
CK, MB: 1
Relative Index: 1
Relative Index: INVALID
Total CK: 100
Troponin I: 0.02

## 2011-04-13 LAB — CULTURE, BLOOD (ROUTINE X 2): Culture: NO GROWTH

## 2011-04-13 LAB — POCT I-STAT 3, ART BLOOD GAS (G3+)
Bicarbonate: 19.8 — ABNORMAL LOW
TCO2: 20
pCO2 arterial: 23.9 — ABNORMAL LOW
pH, Arterial: 7.528 — ABNORMAL HIGH

## 2011-04-13 LAB — COMPREHENSIVE METABOLIC PANEL
ALT: 9
AST: 9
Albumin: 2.3 — ABNORMAL LOW
Alkaline Phosphatase: 52
CO2: 24
Chloride: 105
GFR calc Af Amer: >60
GFR calc non Af Amer: >60
Potassium: 2.8 — ABNORMAL LOW
Sodium: 137
Total Bilirubin: 0.7

## 2011-04-13 LAB — HEMOGLOBIN A1C
Hgb A1c MFr Bld: 5.6
Mean Plasma Glucose: 114

## 2011-04-13 LAB — PROTIME-INR
INR: 1.2
Prothrombin Time: 15.6 — ABNORMAL HIGH

## 2011-04-13 LAB — EXPECTORATED SPUTUM ASSESSMENT W GRAM STAIN, RFLX TO RESP C

## 2011-04-13 LAB — LIPID PANEL: Triglycerides: 71

## 2011-04-13 LAB — CULTURE, RESPIRATORY W GRAM STAIN

## 2013-09-02 ENCOUNTER — Emergency Department (HOSPITAL_COMMUNITY)
Admission: EM | Admit: 2013-09-02 | Discharge: 2013-09-02 | Disposition: A | Discharge status: Home / Self Care | Payer: Medicare Other | Attending: Emergency Medicine | Admitting: Emergency Medicine

## 2013-09-02 ENCOUNTER — Encounter (HOSPITAL_COMMUNITY): Payer: Self-pay | Admitting: Emergency Medicine

## 2013-09-02 DIAGNOSIS — M25569 Pain in unspecified knee: Secondary | ICD-10-CM | POA: Insufficient documentation

## 2013-09-02 DIAGNOSIS — M25561 Pain in right knee: Secondary | ICD-10-CM

## 2013-09-02 DIAGNOSIS — I252 Old myocardial infarction: Secondary | ICD-10-CM | POA: Insufficient documentation

## 2013-09-02 DIAGNOSIS — Z87891 Personal history of nicotine dependence: Secondary | ICD-10-CM | POA: Insufficient documentation

## 2013-09-02 DIAGNOSIS — M25462 Effusion, left knee: Secondary | ICD-10-CM

## 2013-09-02 DIAGNOSIS — M25562 Pain in left knee: Secondary | ICD-10-CM

## 2013-09-02 DIAGNOSIS — M25469 Effusion, unspecified knee: Secondary | ICD-10-CM | POA: Insufficient documentation

## 2013-09-02 DIAGNOSIS — M25461 Effusion, right knee: Secondary | ICD-10-CM

## 2013-09-02 HISTORY — DX: Acute myocardial infarction, unspecified: I21.9

## 2013-09-02 LAB — SYNOVIAL CELL COUNT + DIFF, W/ CRYSTALS
Crystals, Fluid: NONE SEEN
Eosinophils-Synovial: 0 % (ref 0–1)
Lymphocytes-Synovial Fld: 61 % — ABNORMAL HIGH (ref 0–20)
Monocyte-Macrophage-Synovial Fluid: 20 % — ABNORMAL LOW (ref 50–90)
Neutrophil, Synovial: 19 % (ref 0–25)
WBC, Synovial: 127 /mm3 (ref 0–200)

## 2013-09-02 LAB — GRAM STAIN: Special Requests: NORMAL

## 2013-09-02 MED ORDER — OXYCODONE-ACETAMINOPHEN 5-325 MG PO TABS: 1.0000 | ORAL_TABLET | ORAL | Status: AC | PRN

## 2013-09-02 MED ORDER — MORPHINE SULFATE 4 MG/ML IJ SOLN
8.0000 mg | Freq: Once | INTRAMUSCULAR | Status: AC
  Administered 2013-09-02: 8 mg via INTRAMUSCULAR
  Filled 2013-09-02: qty 2

## 2013-09-02 NOTE — ED Notes (Signed)
Called to check on labs, they are in progress

## 2013-09-02 NOTE — Discharge Instructions (Signed)
Arthralgia Arthralgia is joint pain. A joint is a place where two bones meet. Joint pain can happen for many reasons. The joint can be bruised, stiff, infected, or weak from aging. Pain usually goes away after resting and taking medicine for soreness.  HOME CARE  Rest the joint as told by your doctor.  Keep the sore joint raised (elevated) for the first 24 hours.  Put ice on the joint area.  Put ice in a plastic bag.  Place a towel between your skin and the bag.  Leave the ice on for 15-20 minutes, 03-04 times a day.  Wear your splint, casting, elastic bandage, or sling as told by your doctor.  Only take medicine as told by your doctor. Do not take aspirin.  Use crutches as told by your doctor. Do not put weight on the joint until told to by your doctor. GET HELP RIGHT AWAY IF:   You have bruising, puffiness (swelling), or more pain.  Your fingers or toes turn blue or start to lose feeling (numb).  Your medicine does not lessen the pain.  Your pain becomes severe.  You have a temperature by mouth above 102 F (38.9 C), not controlled by medicine.  You cannot move or use the joint. MAKE SURE YOU:   Understand these instructions.  Will watch your condition.  Will get help right away if you are not doing well or get worse. Document Released: 06/16/2009 Document Revised: 09/20/2011 Document Reviewed: 06/16/2009 [facility] Patient Information 2014 [place], [place].  Knee Effusion The medical term for having fluid in your knee is effusion. This is often due to an internal derangement of the knee. This means something is wrong inside the knee. Some of the causes of fluid in the knee may be torn cartilage, a torn ligament, or bleeding into the joint from an injury. Your knee is likely more difficult to bend and move. This is often because there is increased pain and pressure in the joint. The time it takes for recovery from a knee effusion depends on different factors, including:     Type of injury.  Your age.  Physical and medical conditions.  Rehabilitation Strategies. How long you will be away from your normal activities will depend on what kind of knee problem you have and how much damage is present. Your knee has two types of cartilage. Articular cartilage covers the bone ends and lets your knee bend and move smoothly. Two menisci, thick pads of cartilage that form a rim inside the joint, help absorb shock and stabilize your knee. Ligaments bind the bones together and support your knee joint. Muscles move the joint, help support your knee, and take stress off the joint itself. CAUSES  Often an effusion in the knee is caused by an injury to one of the menisci. This is often a tear in the cartilage. Recovery after a meniscus injury depends on how much meniscus is damaged and whether you have damaged other knee tissue. Small tears may heal on their own with conservative treatment. Conservative means rest, limited weight bearing activity and muscle strengthening exercises. Your recovery may take up to 6 weeks.  TREATMENT  Larger tears may require surgery. Meniscus injuries may be treated during arthroscopy. Arthroscopy is a procedure in which your surgeon uses a small telescope like instrument to look in your knee. Your caregiver can make a more accurate diagnosis (learning what is wrong) by performing an arthroscopic procedure. If your injury is on the inner margin of the  meniscus, your surgeon may trim the meniscus back to a smooth rim. In other cases your surgeon will try to repair a damaged meniscus with stitches (sutures). This may make rehabilitation take longer, but may provide better long term result by helping your knee keep its shock absorption capabilities. Ligaments which are completely torn usually require surgery for repair. HOME CARE INSTRUCTIONS  Use crutches as instructed.  If a brace is applied, use as directed.  Once you are home, an ice pack applied  to your swollen knee may help with discomfort and help decrease swelling.  Keep your knee raised (elevated) when you are not up and around or on crutches.  Only take over-the-counter or prescription medicines for pain, discomfort, or fever as directed by your caregiver.  Your caregivers will help with instructions for rehabilitation of your knee. This often includes strengthening exercises.  You may resume a normal diet and activities as directed. SEEK MEDICAL CARE IF:   There is increased swelling in your knee.  You notice redness, swelling, or increasing pain in your knee.  An unexplained oral temperature above 102 F (38.9 C) develops. SEEK IMMEDIATE MEDICAL CARE IF:   You develop a rash.  You have difficulty breathing.  You have any allergic reactions from medications you may have been given.  There is severe pain with any motion of the knee. MAKE SURE YOU:   Understand these instructions.  Will watch your condition.  Will get help right away if you are not doing well or get worse. Document Released: 09/18/2003 Document Revised: 09/20/2011 Document Reviewed: 11/22/2007 [facility] Patient Information 2014 [place], [place].   Emergency Department Resource Guide 1) Find a Doctor and Pay Out of Pocket Although you won't have to find out who is covered by your insurance plan, it is a good idea to ask around and get recommendations. You will then need to call the office and see if the doctor you have chosen will accept you as a new patient and what types of options they offer for patients who are self-pay. Some doctors offer discounts or will set up payment plans for their patients who do not have insurance, but you will need to ask so you aren't surprised when you get to your appointment.  2) Contact Your Local Health Department Not all health departments have doctors that can see patients for sick visits, but many do, so it is worth a call to see if yours does. If you don't  know where your local health department is, you can check in your phone book. The CDC also has a tool to help you locate your state's health department, and many state websites also have listings of all of their local health departments.  3) Find a Walk-in Clinic If your illness is not likely to be very severe or complicated, you may want to try a walk in clinic. These are popping up all over the country in pharmacies, drugstores, and shopping centers. They're usually staffed by nurse practitioners or physician assistants that have been trained to treat common illnesses and complaints. They're usually fairly quick and inexpensive. However, if you have serious medical issues or chronic medical problems, these are probably not your best option.  No Primary Care Doctor: - Call Health Connect at  [phone] - they can help you locate a primary care doctor that  accepts your insurance, provides certain services, etc. - Physician Referral Service- [phone]  Chronic Pain Problems: Organization         Address  Phone   Notes  [facility]  [phone] Patients need to be referred by their primary care doctor.   Medication Assistance: Organization         Address  Phone   Notes  [facility] Medication [facility] [address] [place], [place] [address] [phone] --Must be a resident of [facility] -- Must have NO insurance coverage whatsoever (no Medicaid/ Medicare, etc.) -- The pt. MUST have a primary care doctor that directs their care regularly and follows them in the community   [facility]ist  [phone]   [employer]  [phone]    Agencies that provide inexpensive medical care: Organization         Address  Phone   Notes  [facility]  [phone]   [facility]    [phone]   [facility] [address] [place], [place] [address] [phone]   [facility]  [place] [address], [place] [phone]   Planned Parenthood    [phone]   [facility]    [phone]   Community Health and [facility]  [address], [place] Phone:  [phone], Fax:  [phone] Hours of Operation:  9 am - 6 pm, M-F.  Also accepts Medicaid/Medicare and self-pay.  [facility]  [address], [place] Phone: [phone], Fax: [phone]. Hours of Operation:  8:30 am - 5:30 pm, M-F.  Also accepts Medicaid and self-pay.  [facility] [place] [address] [place] Phone: [phone]   [facility] [address] Natasha Bence [place], [place] [phone], Ext. [phone] Mondays & Thursdays: 7-9 AM.  First 15 patients are seen on a first come, first serve basis.    Medicaid-accepting [facility] Providers:  Organization         Address  Phone   Notes  [facility] [address],  [phone] Also accepts self-pay patients.  [facility] [address] Laurell Josephs [place], [place]  [phone]   [facility] [address], [place] [phone]   [facility] [address], [place] [phone]   Renaye Rakers [address], [place]   [phone] Only accepts [place] Access [place] patients after they have their name applied to their card.   Self-Pay (no insurance) in [facility]:  Organization         Address  Phone   Notes  Sickle Cell Patients, [facility] [address] [place], [place] [phone]   [facility] [address] [place], [place] [phone]   [facility] [place]  [address], [place] [phone]   [facility]/Dr. Osei-Bonsu  [address] [place] or [address], [place] [phone] Phone  number for both [place] and [place] locations is the same.  Urgent Medical and [facility] [address], [place] [phone]   [facility] [address] [place] or [address] [phone]   [facility] [address], [place] [phone], phone; [phone], fax Sees patients 1st and 3rd Saturday of every month.  Must not qualify for public  or private insurance (i.e. Medicaid, Medicare, [place] Health Choice, Veterans' Benefits)  Household income should be no more than 200% of the poverty level The clinic cannot treat you if you are pregnant or think you are pregnant  Sexually transmitted diseases are not treated at the clinic.    Dental Care: Organization         Address  Phone  Notes  [facility] [address], [place] [phone] Accepts children up to age 67 who are enrolled in [place] or [place] Health Choice; pregnant women with a Medicaid card; and children who have applied for Medicaid or [place] Health Choice, but were declined, whose parents can pay a reduced fee at time of service.  [facility] [place]  [address], [place] [phone]) [phone] Accepts children up to age 67 who are enrolled in [place] or [place] Health Choice; pregnant women with a Medicaid card; and children who have applied for Medicaid or  Health Choice, but were declined, whose parents can pay a reduced fee at time of service.  [facility]  [address], [place] [phone] Patients are seen by appointment only. Walk-ins are not accepted. [facility] will see patients 67 years of age and older. Monday - Tuesday (8am-5pm) Most Wednesdays (8:30-5pm) $30 per visit, cash only  [facility]  [address], [facility] [place] [phone] Patients are seen by appointment only.  Walk-ins are not accepted. [facility] will see patients 67 years of age and older. One Wednesday Evening (Monthly: Volunteer Based).  $30 per visit, cash only  [employer] [facility]  [phone]) [phone] for adults; Children under age 274, call Graduate Pediatric Dentistry at [phone]. Children aged 64-14, please call [phone] to request a pediatric application.  Dental services are provided in all areas of dental care including fillings, crowns and bridges, complete and partial dentures, implants, gum treatment, root canals, and extractions. Preventive care is also provided. Treatment is provided to both adults and children. Patients are selected via a lottery and there is often a waiting list.   [facility] [address], [place]  [phone] [email or web address]   [facility] [address], [place], [place] [phone], Ext. [phone] Second and Fourth Thursday of each month, opens at 6:30 AM; Clinic ends at 9 AM.  Patients are seen on a first-come first-served basis, and a limited number are seen during each clinic.   [facility]  [address], [place], [place] [phone]) [phone]   Eligibility Requirements You must have lived in [place], [place], or [place] counties for at least the last three months.   You cannot be eligible for state or federal sponsored [place] Cityhealthcare insurance, including CIGNAVeterans Administration, [place]icaid, or [employer]icare.   You generally cannot be eligible for healthcare insurance through your employer.    How to apply: Eligibility screenings are held every Tuesday and Wednesday afternoon from 1:00 pm until 4:00 pm. You do not need an appointment for the interview!  [facility] [address], [place], [place] [phone]   [facility]  [phone]   [facility]  [phone]   [facility]  [phone]    Behavioral Health  Resources in the Community: Intensive Outpatient Programs Organization         Address  Phone  Notes  [facility] [address] [place], [place] [phone]   [facility] Outpatient [address] [place], [place] [phone]   [facility] [address], [place], [place]  [phone]   [facility] [address],  [place], [place] [phone] or [phone]   Substance Abuse Resources Organization         Address  Phone  Notes  Alcohol [facility] Drug Services  [phone]   [facility]  [phone]   [facility]  [phone]   Floydene Flock  [phone]   Residential & Outpatient Substance Abuse Program  [phone]   Psychological Services Organization         Address  Phone  Notes  [facility]  [phone]   [facility]  [phone]   [facility] [address], [place] [phone] or [phone]    Mobile Crisis Teams Organization         Address  Phone  Notes  [facility], Mobile Crisis Care Unit  [phone]   [facility]  [address] [place], [place] [phone]   Doristine Locks [address] [place] [phone]    Self-Help/Support Groups Organization         Address  Phone             Notes  [facility]ental [facility]oc[facility] of [employer] - variety of support groups  [phone]- [phone] Call for more information  Narcotics Anonymous (NA), Caring Services [address], [employer] [place]  2 meetings at this location   Statistician         Address  Phone  Notes  [facility] [address],    [place]  [phone]   [facility]  [address], [place] [address], [place], [place] [phone]   [facility] [address] [place], [place] [phone] Admissions: 8am-3pm M-F  [facility]  [facility] Center [address],    [place], [place] [phone]   [facility] [address] [place], [place], [place] [phone]   [facility] [address]  [place], [place] [phone]   [facility] - Intensive Outpatient [address]ure[address], [place], [place] [phone]   [facility] ([facility]iction [facility] [address] [place]  [place], [place] [phone] or [phone]   [facility] ([facility]) [address] [place], [place] [phone] Accepts Medicaid  [facility] [address]  [place] [phone] Substance Abuse/Addiction Treatment   [facility] Organization         Address  Phone  Notes  [facility]  [phone]   Angie Fava, PhD [address] Ervin Knack [place], [place]   [phone] or [phone]   [facility]   [address] [place], [place] [phone]   [facility] [address], [place], [place] [phone] Insurance/Medicaid/sponsorship through [facility] Families [address]                                    [place], [place] [phone] Therapy/tele-psych/case  [facility] [address], [place] [phone]    Dr. Lolly Mustache  [phone]   [facility]  [employer] [facility]  Dept. 1) [address], [place] 2) [address], [place] 3)  [address], [place] [phone]) [phone]  [phone]   [facility] Child Abuse Hotline [phone]) [phone] or [phone](336) [phone] (After Hours)

## 2013-09-02 NOTE — ED Provider Notes (Signed)
CSN: [id number]     Arrival date & time 09/02/13  [id number] History   First MD Initiated Contact with Patient 09/02/13 [phone]     Chief Complaint  Patient presents with  . Knee Pain     (Consider location/radiation/quality/duration/timing/severity/associated sxs/prior Treatment) HPI  68 year old male with bilateral knee pain, left worse than right. Patient thinks gradual onset about 2 days ago. Can't be safely fall, but patient denies. Patient reports a past history of pain and swelling in his left knee previously requiring "taking fluid out." Denies pain any other joints. No fevers or chills. No past history on either knee. No intervention prior to arrival.  Past Medical History  Diagnosis Date  . MI (myocardial infarction)    History reviewed. No pertinent past surgical history. History reviewed. No pertinent family history. History  Substance Use Topics  . Smoking status: Former Games developermoker  . Smokeless tobacco: Never Used  . Alcohol Use: Yes     Comment: 1 pint a week    Review of Systems  All systems reviewed and negative, other than as noted in HPI.   Allergies  Review of patient's allergies indicates no known allergies.  Home Medications  No current outpatient prescriptions on file. BP 129/84  Pulse 112  Temp(Src) 97 F (36.1 C) (Oral)  Resp 22  Ht 6\' 3"  (1.905 m)  Wt 177 lb (80.287 kg)  BMI 22.12 kg/m2  SpO2 98% Physical Exam  Nursing note and vitals reviewed. Constitutional: He appears well-developed and well-nourished. No distress.  HENT:  Head: Normocephalic and atraumatic.  Eyes: Conjunctivae are normal. Right eye exhibits no discharge. Left eye exhibits no discharge.  Neck: Neck supple.  Cardiovascular: Normal rate, regular rhythm and normal heart sounds.  Exam reveals no gallop and no friction rub.   No murmur heard. Pulmonary/Chest: Effort normal and breath sounds normal. No respiratory distress.  Abdominal: Soft. He exhibits no distension. There is no  tenderness.  Musculoskeletal: He exhibits no edema and no tenderness.  Bilateral knee effusions, left worse than right. Mild diffuse tenderness to palpation worse over the medial aspect of the left knee. Patient can actively range his knees, although with increased pain. He is neurovascularly intact distally.  Neurological: He is alert.  Skin: Skin is warm and dry.  Psychiatric: He has a normal mood and affect. His behavior is normal. Thought content normal.    ED Course  ARTHOCENTESIS Date/Time: 09/02/2013 7:30 AM Performed by: Raeford RazorKOHUT, Porcia Morganti Authorized by: Raeford RazorKOHUT, Latashia Koch Consent: Verbal consent obtained. Risks and benefits: risks, benefits and alternatives were discussed Consent given by: patient Patient identity confirmed: verbally with patient and provided demographic data Indications: joint swelling,  pain and diagnostic evaluation  Body area: knee Local anesthesia used: yes Local anesthetic: lidocaine 2% without epinephrine Anesthetic total: 2 ml Patient sedated: no Preparation: Patient was prepped and draped in the usual sterile fashion. Ultrasound guidance: no Approach: lateral Aspirate: yellow Lidocaine 2% amount: 8 ml Patient tolerance: Patient tolerated the procedure well with no immediate complications.   (including critical care time) Labs Review Labs Reviewed  BODY FLUID CULTURE  GRAM STAIN  SYNOVIAL CELL COUNT + DIFF, W/ CRYSTALS   Imaging Review No results found.  No results found.  EKG Interpretation   None       MDM   Final diagnoses:  Bilateral knee pain  Bilateral knee effusions     68 year old male with what seems to be atraumatic bilateral knee pain with associated effusions, left worse than right. Clinically does  not appear to be infected. Arthrocentesis does not support this either. No crystals on synovial fluid noted. Patient now pain free after arthrocentesis and injection of anesthetic. Plan continued symptomatic treatment. Return  precautions were discussed. Orthopedic followup otherwise.     Raeford Razor, MD 09/06/13 1345

## 2013-09-02 NOTE — ED Notes (Signed)
Pt states left sided knee with bilateral knee swelling. Knees appear swollen and feel warm. Skin color is normal for race. Pt denies fall or trauma, but family in room says she think he fell. Sensation present below site, pt can wiggle digits. Cap refill less than 3 seconds.

## 2013-09-05 LAB — BODY FLUID CULTURE
Culture: NO GROWTH
Special Requests: NORMAL

## 2014-03-12 DEATH — deceased
